# Patient Record
Sex: Male | Born: 1945
Health system: Southern US, Community
[De-identification: ages and names within clinical notes are randomized; demographics above are authoritative.]

## PROBLEM LIST (undated history)

## (undated) DIAGNOSIS — R112 Nausea with vomiting, unspecified: Secondary | ICD-10-CM

## (undated) DIAGNOSIS — Z87442 Personal history of urinary calculi: Secondary | ICD-10-CM

## (undated) DIAGNOSIS — T753XXA Motion sickness, initial encounter: Secondary | ICD-10-CM

## (undated) DIAGNOSIS — T4145XA Adverse effect of unspecified anesthetic, initial encounter: Secondary | ICD-10-CM

## (undated) DIAGNOSIS — I499 Cardiac arrhythmia, unspecified: Secondary | ICD-10-CM

## (undated) DIAGNOSIS — Z9889 Other specified postprocedural states: Secondary | ICD-10-CM

## (undated) DIAGNOSIS — T8859XA Other complications of anesthesia, initial encounter: Secondary | ICD-10-CM

## (undated) HISTORY — PX: CIRCUMCISION: SUR203

## (undated) HISTORY — PX: CYST EXCISION: SHX5701

## (undated) HISTORY — PX: COLONOSCOPY: SHX5424

## (undated) HISTORY — PX: VASECTOMY: SHX75

---

## 2010-04-09 HISTORY — PX: EYE SURGERY: SHX253

## 2011-01-22 ENCOUNTER — Ambulatory Visit: Payer: Self-pay | Admitting: Ophthalmology

## 2014-01-19 ENCOUNTER — Ambulatory Visit: Payer: Self-pay | Admitting: Internal Medicine

## 2014-01-19 IMAGING — US ULTRASOUND RETROPERITONEAL COMPLETE
1 series · 14 of 25 positions shown · non-contrast
Comparison: None.

CLINICAL DATA: Abdominal aortic aneurysm screening

EXAM:
ULTRASOUND RETROPERITONEAL COMPLETE
TECHNIQUE: Ultrasound examination of the abdominal aorta was performed to
evaluate for abdominal aortic aneurysm. The common iliac arteries,
IVC, and kidneys were also evaluated.

[Series 1: ultrasound retroperitoneal complete · 0.31mm/px · 14 of 49 slices shown]
[im 1/49]
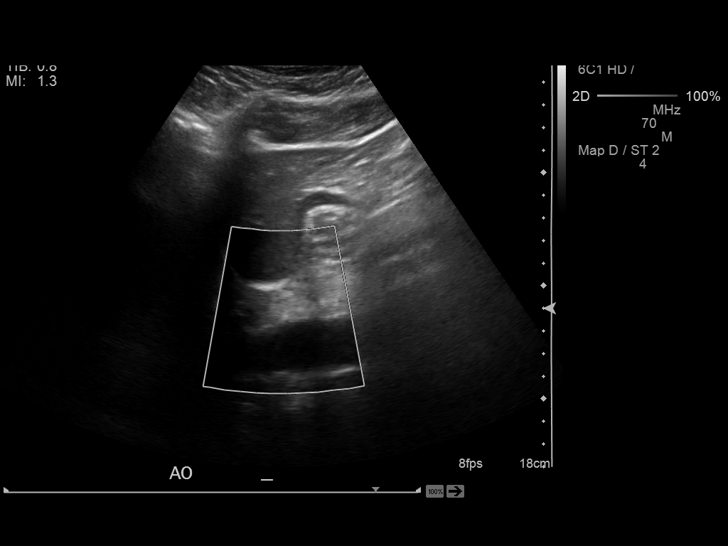
[im 5/49]
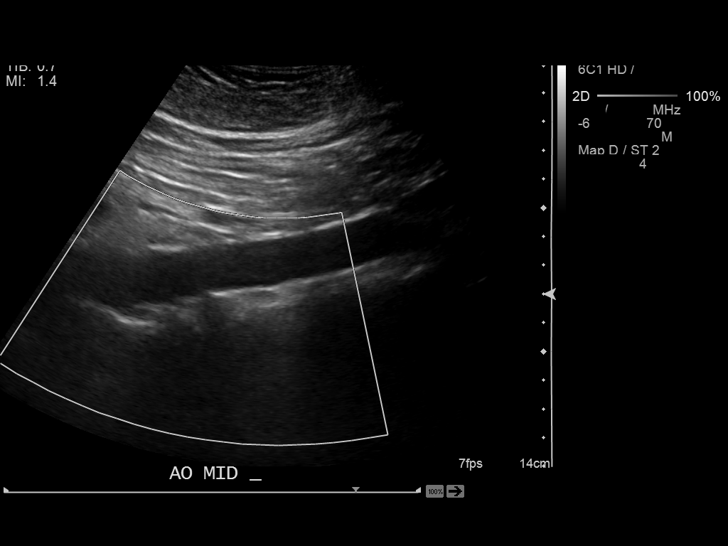
[im 9/49]
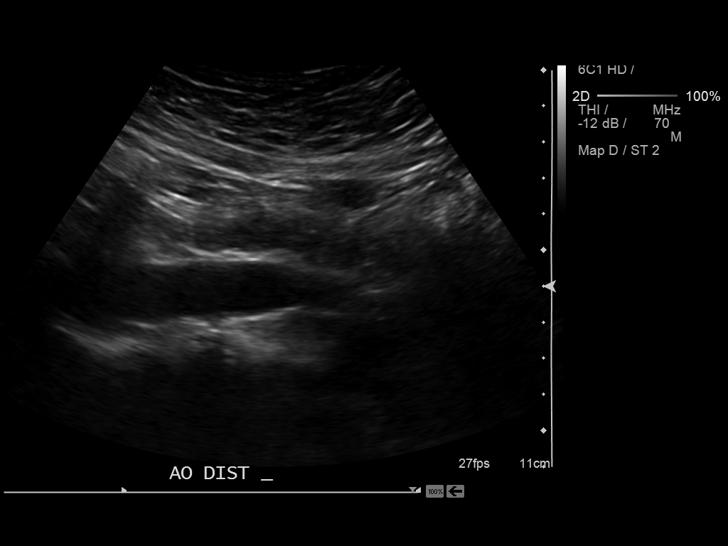
[im 13/49]
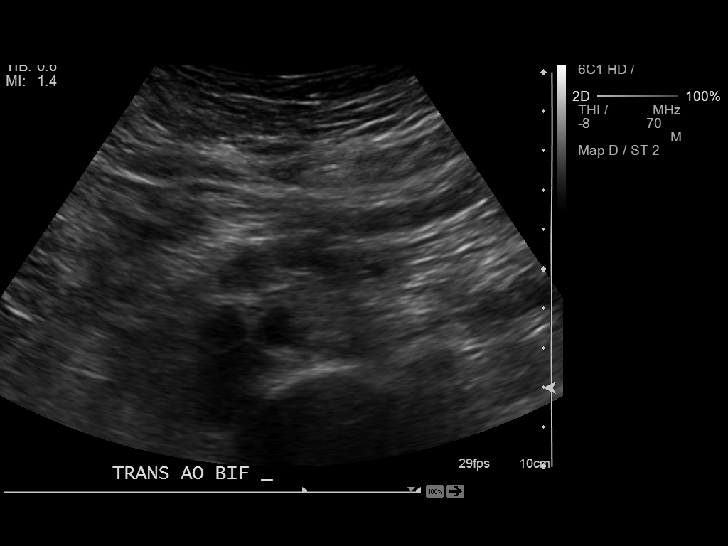
[im 17/49]
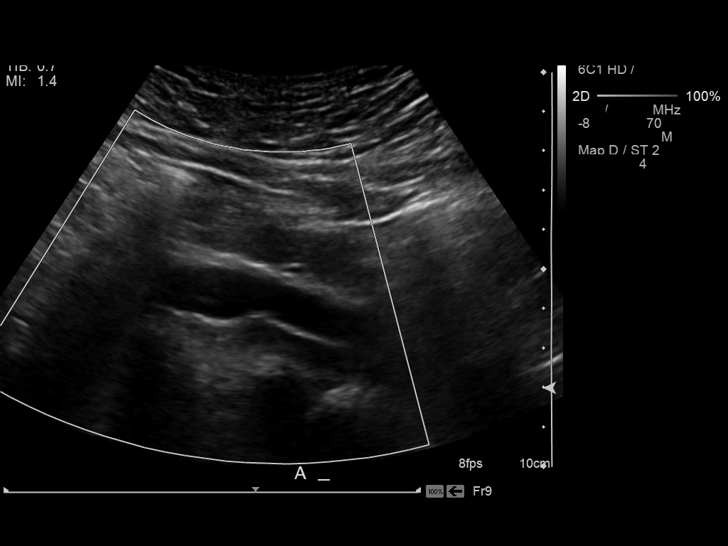
[im 19/49]
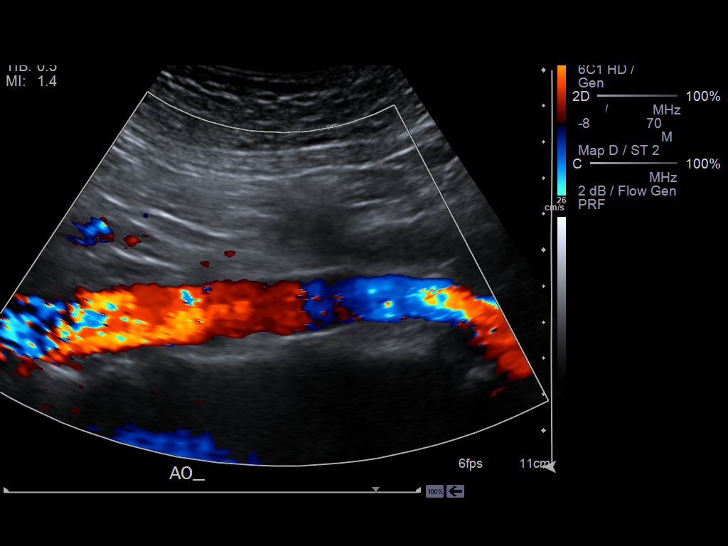
[im 23/49]
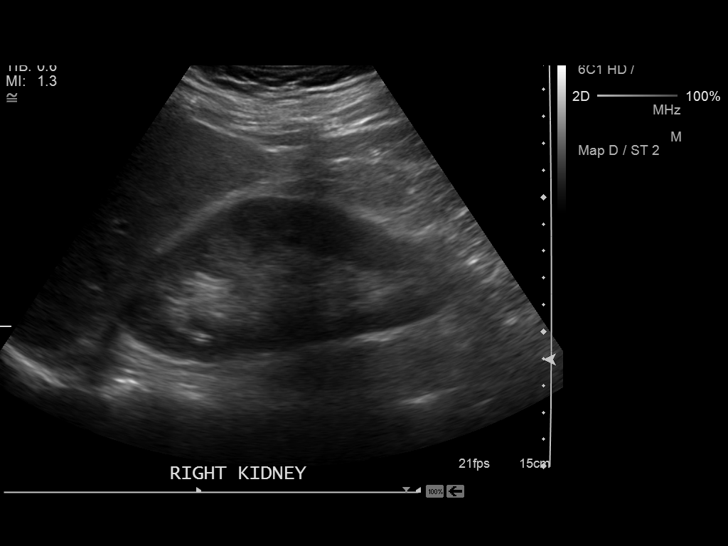
[im 27/49]
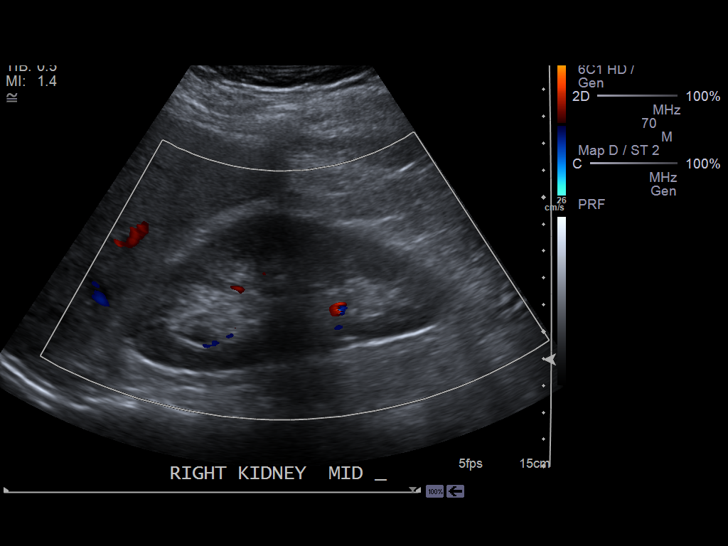
[im 31/49]
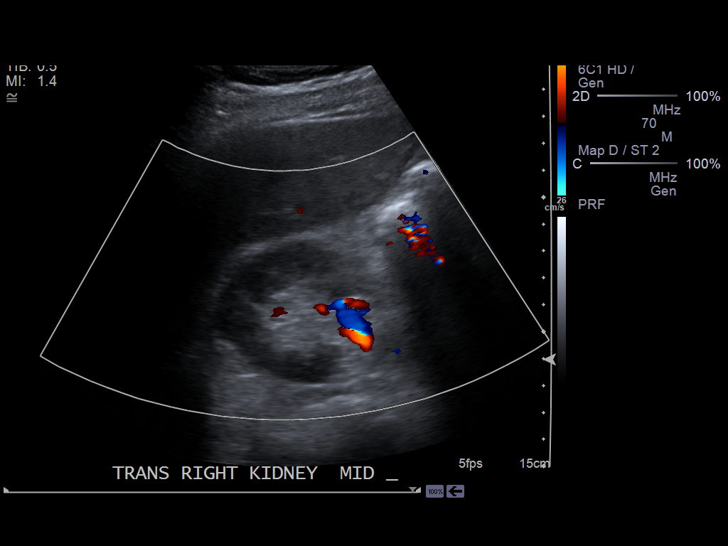
[im 33/49]
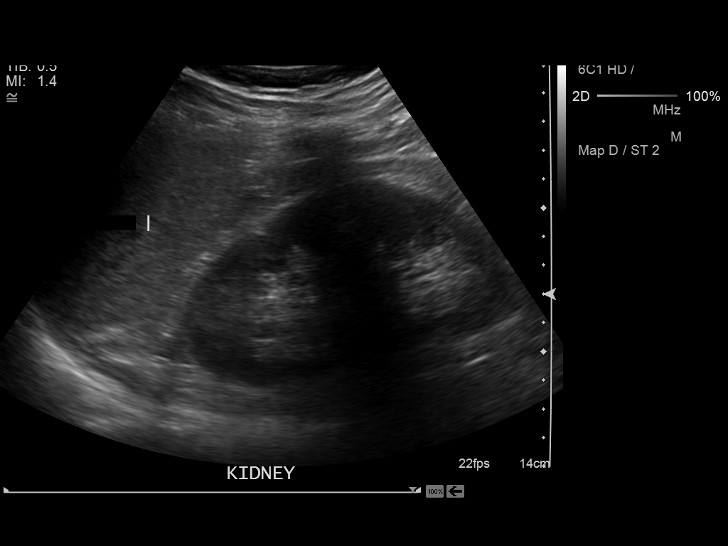
[im 37/49]
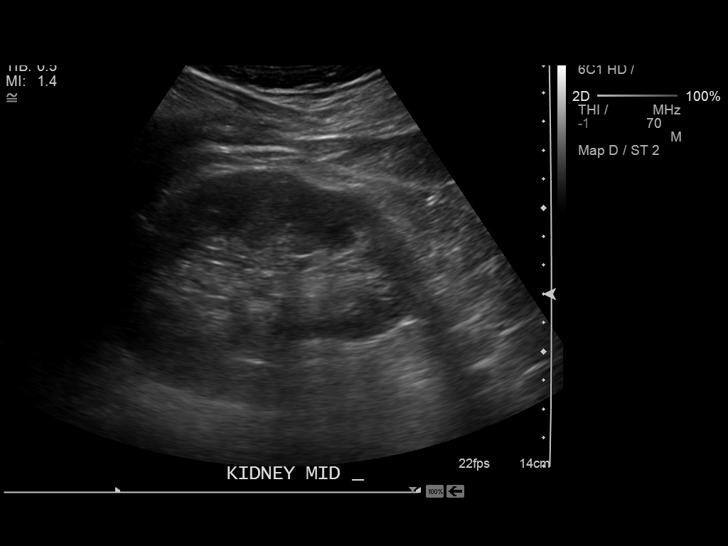
[im 41/49]
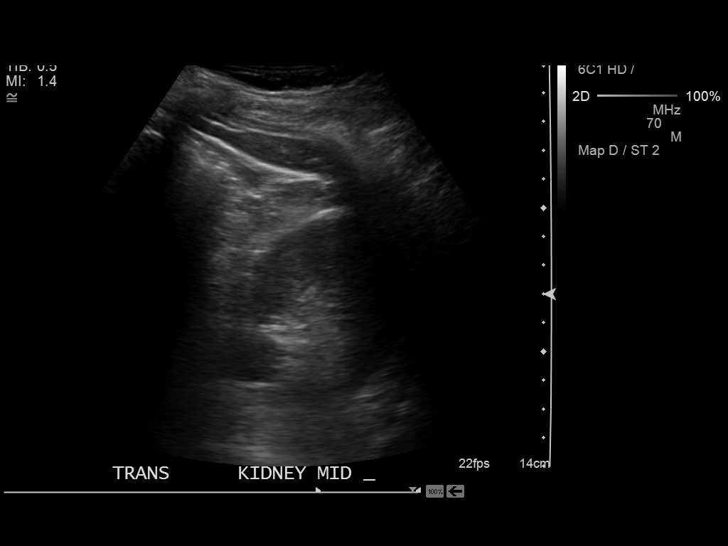
[im 45/49]
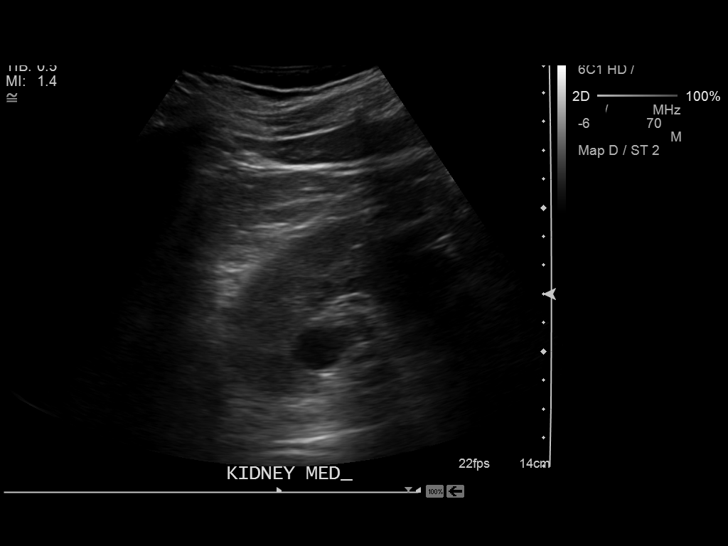
[im 49/49]
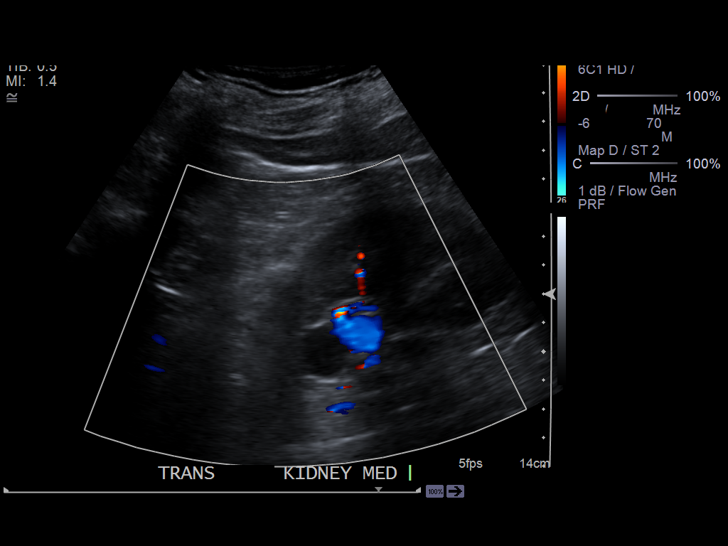

[14 of 25 positions shown; findings below may reference images not displayed]

FINDINGS: Abdominal Aorta

No aneurysm identified.

Maximum AP

Diameter:  1.9 cm in the mid aorta.

Maximum TRV

Diameter: 1.7 cm in the mid aorta.

Right Common Iliac Artery

No aneurysm identified.

Left Common Iliac Artery

No aneurysm identified.

IVC

No abnormality visualized.

Right Kidney

Length: 12.7 cm Echogenicity within normal limits. No mass or
hydronephrosis visualized.

Left Kidney

Length: 10.8 cm. 19 mm cyst. Echogenicity within normal limits. No
mass or hydronephrosis visualized.
IMPRESSION: Negative for abdominal aortic aneurysm.

## 2014-09-08 DIAGNOSIS — H4011X2 Primary open-angle glaucoma, moderate stage: Secondary | ICD-10-CM | POA: Diagnosis not present

## 2014-11-24 DIAGNOSIS — R3915 Urgency of urination: Secondary | ICD-10-CM | POA: Diagnosis not present

## 2014-11-24 DIAGNOSIS — N401 Enlarged prostate with lower urinary tract symptoms: Secondary | ICD-10-CM | POA: Diagnosis not present

## 2014-11-24 DIAGNOSIS — R351 Nocturia: Secondary | ICD-10-CM | POA: Diagnosis not present

## 2014-11-24 DIAGNOSIS — R3914 Feeling of incomplete bladder emptying: Secondary | ICD-10-CM | POA: Diagnosis not present

## 2014-12-28 DIAGNOSIS — D72819 Decreased white blood cell count, unspecified: Secondary | ICD-10-CM | POA: Diagnosis not present

## 2014-12-28 DIAGNOSIS — Z125 Encounter for screening for malignant neoplasm of prostate: Secondary | ICD-10-CM | POA: Diagnosis not present

## 2014-12-28 DIAGNOSIS — D6489 Other specified anemias: Secondary | ICD-10-CM | POA: Diagnosis not present

## 2014-12-28 DIAGNOSIS — N402 Nodular prostate without lower urinary tract symptoms: Secondary | ICD-10-CM | POA: Diagnosis not present

## 2014-12-28 DIAGNOSIS — M25511 Pain in right shoulder: Secondary | ICD-10-CM | POA: Diagnosis not present

## 2014-12-28 DIAGNOSIS — Z87898 Personal history of other specified conditions: Secondary | ICD-10-CM | POA: Diagnosis not present

## 2014-12-28 DIAGNOSIS — Z0000 Encounter for general adult medical examination without abnormal findings: Secondary | ICD-10-CM | POA: Diagnosis not present

## 2015-01-11 DIAGNOSIS — M25511 Pain in right shoulder: Secondary | ICD-10-CM | POA: Diagnosis not present

## 2015-01-11 DIAGNOSIS — D6489 Other specified anemias: Secondary | ICD-10-CM | POA: Diagnosis not present

## 2015-01-11 DIAGNOSIS — G8929 Other chronic pain: Secondary | ICD-10-CM | POA: Diagnosis not present

## 2015-01-11 DIAGNOSIS — Z87898 Personal history of other specified conditions: Secondary | ICD-10-CM | POA: Diagnosis not present

## 2015-01-11 DIAGNOSIS — Z0000 Encounter for general adult medical examination without abnormal findings: Secondary | ICD-10-CM | POA: Diagnosis not present

## 2015-01-11 DIAGNOSIS — N400 Enlarged prostate without lower urinary tract symptoms: Secondary | ICD-10-CM | POA: Diagnosis not present

## 2015-03-08 DIAGNOSIS — H401123 Primary open-angle glaucoma, left eye, severe stage: Secondary | ICD-10-CM | POA: Diagnosis not present

## 2015-05-13 DIAGNOSIS — R21 Rash and other nonspecific skin eruption: Secondary | ICD-10-CM | POA: Diagnosis not present

## 2015-05-13 DIAGNOSIS — Z7982 Long term (current) use of aspirin: Secondary | ICD-10-CM | POA: Diagnosis not present

## 2015-09-06 DIAGNOSIS — H401123 Primary open-angle glaucoma, left eye, severe stage: Secondary | ICD-10-CM | POA: Diagnosis not present

## 2015-09-30 DIAGNOSIS — H2511 Age-related nuclear cataract, right eye: Secondary | ICD-10-CM | POA: Diagnosis not present

## 2015-12-13 DIAGNOSIS — R350 Frequency of micturition: Secondary | ICD-10-CM | POA: Diagnosis not present

## 2015-12-13 DIAGNOSIS — R3914 Feeling of incomplete bladder emptying: Secondary | ICD-10-CM | POA: Diagnosis not present

## 2015-12-13 DIAGNOSIS — N5201 Erectile dysfunction due to arterial insufficiency: Secondary | ICD-10-CM | POA: Diagnosis not present

## 2015-12-13 DIAGNOSIS — N401 Enlarged prostate with lower urinary tract symptoms: Secondary | ICD-10-CM | POA: Diagnosis not present

## 2016-01-30 ENCOUNTER — Encounter
Admission: RE | Admit: 2016-01-30 | Discharge: 2016-01-30 | Disposition: A | Discharge status: Home / Self Care | Payer: Commercial Managed Care - HMO | Source: Ambulatory Visit | Attending: Urology | Admitting: Urology

## 2016-01-30 DIAGNOSIS — Z0000 Encounter for general adult medical examination without abnormal findings: Secondary | ICD-10-CM | POA: Diagnosis not present

## 2016-01-30 DIAGNOSIS — Z7982 Long term (current) use of aspirin: Secondary | ICD-10-CM | POA: Diagnosis not present

## 2016-01-30 HISTORY — DX: Personal history of urinary calculi: Z87.442

## 2016-01-30 HISTORY — DX: Other specified postprocedural states: Z98.890

## 2016-01-30 HISTORY — DX: Adverse effect of unspecified anesthetic, initial encounter: T41.45XA

## 2016-01-30 HISTORY — DX: Other complications of anesthesia, initial encounter: T88.59XA

## 2016-01-30 HISTORY — DX: Nausea with vomiting, unspecified: R11.2

## 2016-01-30 HISTORY — DX: Cardiac arrhythmia, unspecified: I49.9

## 2016-01-30 NOTE — Patient Instructions (Signed)
  Your procedure is scheduled on: 02-07-16 (TUESDAY) Report to Same Day Surgery 2nd floor medical mall To find out your arrival time please call [phone] between 1PM - 3PM on 02-06-16 [facility])  Remember: Instructions that are not followed completely may result in serious medical risk, up to and including death, or upon the discretion of your surgeon and anesthesiologist your surgery may need to be rescheduled.    _x___ 1. Do not eat food or drink liquids after midnight. No gum chewing or hard candies.     __x__ 2. No Alcohol for 24 hours before or after surgery.   __x__3. No Smoking for 24 prior to surgery.   ____  4. Bring all medications with you on the day of surgery if instructed.    __x__ 5. Notify your doctor if there is any change in your medical condition     (cold, fever, infections).     Do not wear jewelry, make-up, hairpins, clips or nail polish.  Do not wear lotions, powders, or perfumes. You may wear deodorant.  Do not shave 48 hours prior to surgery. Men may shave face and neck.  Do not bring valuables to the hospital.    [facility] is not responsible for any belongings or valuables.               Contacts, dentures or bridgework may not be worn into surgery.  Leave your suitcase in the car. After surgery it may be brought to your room.  For patients admitted to the hospital, discharge time is determined by your treatment team.   Patients discharged the day of surgery will not be allowed to drive home.    Please read over the following fact sheets that you were given:   [facility] Preparing for Surgery and or MRSA Information   ____ Take these medicines the morning of surgery with A SIP OF WATER:    1. NONE  2.  3.  4.  5.  6.  ____Fleets enema or Magnesium Citrate as directed.   ____ Use CHG Soap or sage wipes as directed on instruction sheet   ____ Use inhalers on the day of surgery and bring to hospital day of surgery  ____ Stop metformin 2  days prior to surgery    ____ Take 1/2 of usual insulin dose the night before surgery and none on the morning of   surgery.   _X___ Stop aspirin or coumadin, or plavix-STOP ASPIRIN NOW  x__ Stop Anti-inflammatories such as Advil, Aleve, Ibuprofen, Motrin, Naproxen,          Naprosyn, Goodies powders or aspirin products NOW- Ok to take Tylenol.   _X___ Stop supplements until after surgery-STOP FISH OIL   ____ Bring C-Pap to the hospital.

## 2016-01-31 ENCOUNTER — Ambulatory Visit
Admission: RE | Admit: 2016-01-31 | Discharge: 2016-01-31 | Disposition: A | Discharge status: Home / Self Care | Payer: Commercial Managed Care - HMO | Source: Ambulatory Visit | Attending: Urology | Admitting: Urology

## 2016-01-31 DIAGNOSIS — Z01810 Encounter for preprocedural cardiovascular examination: Secondary | ICD-10-CM | POA: Diagnosis not present

## 2016-01-31 DIAGNOSIS — I499 Cardiac arrhythmia, unspecified: Secondary | ICD-10-CM | POA: Insufficient documentation

## 2016-01-31 DIAGNOSIS — Z87442 Personal history of urinary calculi: Secondary | ICD-10-CM | POA: Insufficient documentation

## 2016-02-02 NOTE — H&P (Signed)
NAME:  ETHON, CAMPBEL NO.:  [id number]  MEDICAL RECORD NO.:  [id number]  LOCATION:                                 FACILITY:  PHYSICIAN:  Maryan Puls          DATE OF BIRTH:  1945/06/28  DATE OF ADMISSION: DATE OF DISCHARGE:                            HISTORY AND PHYSICAL   Same-day surgery, October 31.  CHIEF COMPLAINT:  Difficulty voiding.  HISTORY OF PRESENT ILLNESS:  Mr. Lorig is a 70 year old white male with a long history of BPH and lower urinary tract symptoms.  At the present time, he complains of incomplete emptying, frequency, intermittency, urgency, weak stream, straining, and nocturia.  AUA symptom score was 21 with a quality of life score of 5.  This is in spite of treatment with tamsulosin and finasteride.  The patient comes in now for photovaporization of the prostate with a GreenLight laser.  PAST MEDICAL HISTORY:  The patient has an allergy to penicillin.  CURRENT MEDICATIONS:  Included fish oil, vitamins, ibuprofen, aspirin, tamsulosin, and finasteride.  SURGICAL HISTORY: 1. Circumcision in 1983. 2. Cataract repair in 2012. 3. Glaucoma surgery in 2011.  SOCIAL HISTORY:  The patient quit smoking in 1975 with a 10 pack-year history.  He consumes 2 alcoholic beverages per week.  FAMILY HISTORY:  Remarkable for mother who is living at age 19 but has had a stroke.  PAST AND CURRENT MEDICAL CONDITIONS: 1. Osteoarthritis. 2. Glaucoma. 3. Tendinitis of the left ear. 4. Erectile dysfunction.  REVIEW OF SYSTEMS:  The patient denied chest pain, shortness of breath, diabetes, stroke, or hypertension.  PHYSICAL EXAMINATION:  GENERAL:  A well-nourished white male, in no acute distress. HEENT:  Sclerae were clear. NECK:  Supple.  No palpable cervical adenopathy. LUNGS:  Clear to auscultation. CARDIOVASCULAR:  Regular rhythm and rate without audible murmurs. ABDOMEN:  Soft, nontender abdomen. GU:  Circumcised testes, smooth, and  nontender.  No palpable hernia defects. RECTAL:  Greater than 50 g, smooth, nontender prostate. NEUROMUSCULAR:  Alert and oriented x3.  IMPRESSION:  Benign prostatic hypertrophy with bladder outlet obstruction.  PLAN:  Photovaporization of prostate with GreenLight laser.    ______________________________ Maryan Puls   ______________________________ Maryan Puls    MW/MEDQ  D:  02/01/2016  T:  02/02/2016  Job:  [id number]

## 2016-02-06 DIAGNOSIS — Z0000 Encounter for general adult medical examination without abnormal findings: Secondary | ICD-10-CM | POA: Diagnosis not present

## 2016-02-06 DIAGNOSIS — N400 Enlarged prostate without lower urinary tract symptoms: Secondary | ICD-10-CM | POA: Diagnosis not present

## 2016-02-06 DIAGNOSIS — D649 Anemia, unspecified: Secondary | ICD-10-CM | POA: Diagnosis not present

## 2016-02-06 DIAGNOSIS — Z23 Encounter for immunization: Secondary | ICD-10-CM | POA: Diagnosis not present

## 2016-02-06 DIAGNOSIS — D72819 Decreased white blood cell count, unspecified: Secondary | ICD-10-CM | POA: Diagnosis not present

## 2016-02-07 ENCOUNTER — Encounter
Admission: RE | Disposition: A | Discharge status: Home / Self Care | Payer: Self-pay | Source: Ambulatory Visit | Attending: Urology

## 2016-02-07 ENCOUNTER — Encounter: Payer: Self-pay | Admitting: *Deleted

## 2016-02-07 ENCOUNTER — Ambulatory Visit
Admission: RE | Admit: 2016-02-07 | Discharge: 2016-02-07 | Disposition: A | Discharge status: Home / Self Care | Payer: Commercial Managed Care - HMO | Source: Ambulatory Visit | Attending: Urology | Admitting: Urology

## 2016-02-07 ENCOUNTER — Ambulatory Visit: Payer: Commercial Managed Care - HMO | Admitting: Anesthesiology

## 2016-02-07 DIAGNOSIS — N138 Other obstructive and reflux uropathy: Secondary | ICD-10-CM | POA: Insufficient documentation

## 2016-02-07 DIAGNOSIS — N401 Enlarged prostate with lower urinary tract symptoms: Secondary | ICD-10-CM | POA: Diagnosis not present

## 2016-02-07 DIAGNOSIS — Z87891 Personal history of nicotine dependence: Secondary | ICD-10-CM | POA: Diagnosis not present

## 2016-02-07 DIAGNOSIS — Z7982 Long term (current) use of aspirin: Secondary | ICD-10-CM | POA: Insufficient documentation

## 2016-02-07 DIAGNOSIS — N400 Enlarged prostate without lower urinary tract symptoms: Secondary | ICD-10-CM | POA: Diagnosis not present

## 2016-02-07 DIAGNOSIS — N320 Bladder-neck obstruction: Secondary | ICD-10-CM | POA: Diagnosis not present

## 2016-02-07 HISTORY — PX: GREEN LIGHT LASER TURP (TRANSURETHRAL RESECTION OF PROSTATE: SHX6260

## 2016-02-07 SURGERY — GREEN LIGHT LASER TURP (TRANSURETHRAL RESECTION OF PROSTATE
Anesthesia: General

## 2016-02-07 MED ORDER — PHENYLEPHRINE HCL 10 MG/ML IJ SOLN
INTRAMUSCULAR | Status: DC | PRN
  Administered 2016-02-07: 100 ug via INTRAVENOUS
  Administered 2016-02-07: 50 ug via INTRAVENOUS

## 2016-02-07 MED ORDER — ACETAMINOPHEN-CODEINE #3 300-30 MG PO TABS: 1.0000 | ORAL_TABLET | ORAL | 2 refills | Status: DC | PRN

## 2016-02-07 MED ORDER — LACTATED RINGERS IV SOLN
INTRAVENOUS | Status: DC
  Administered 2016-02-07: 11:00:00 via INTRAVENOUS

## 2016-02-07 MED ORDER — LACTATED RINGERS IV SOLN
Freq: Once | INTRAVENOUS | Status: AC
  Administered 2016-02-07: 14:00:00 via INTRAVENOUS

## 2016-02-07 MED ORDER — PROPOFOL 10 MG/ML IV BOLUS
INTRAVENOUS | Status: DC | PRN
  Administered 2016-02-07: 170 mg via INTRAVENOUS

## 2016-02-07 MED ORDER — FENTANYL CITRATE (PF) 100 MCG/2ML IJ SOLN
INTRAMUSCULAR | Status: DC | PRN
  Administered 2016-02-07 (×4): 25 ug via INTRAVENOUS

## 2016-02-07 MED ORDER — FAMOTIDINE 20 MG PO TABS
ORAL_TABLET | ORAL | Status: AC
  Filled 2016-02-07: qty 1

## 2016-02-07 MED ORDER — ONDANSETRON HCL 4 MG/2ML IJ SOLN
INTRAMUSCULAR | Status: DC | PRN
  Administered 2016-02-07: 4 mg via INTRAVENOUS

## 2016-02-07 MED ORDER — ONDANSETRON HCL 4 MG/2ML IJ SOLN: 4.0000 mg | Freq: Once | INTRAMUSCULAR | Status: DC | PRN

## 2016-02-07 MED ORDER — ONDANSETRON 8 MG PO TBDP: 8.0000 mg | ORAL_TABLET | Freq: Four times a day (QID) | ORAL | 3 refills | Status: DC | PRN

## 2016-02-07 MED ORDER — LIDOCAINE HCL 2 % EX GEL
CUTANEOUS | Status: DC | PRN
  Administered 2016-02-07: 1 via URETHRAL

## 2016-02-07 MED ORDER — DEXAMETHASONE SODIUM PHOSPHATE 10 MG/ML IJ SOLN
INTRAMUSCULAR | Status: DC | PRN
  Administered 2016-02-07: 10 mg via INTRAVENOUS

## 2016-02-07 MED ORDER — FENTANYL CITRATE (PF) 100 MCG/2ML IJ SOLN
INTRAMUSCULAR | Status: AC
  Administered 2016-02-07: 25 ug via INTRAVENOUS
  Filled 2016-02-07: qty 2

## 2016-02-07 MED ORDER — LEVOFLOXACIN IN D5W 500 MG/100ML IV SOLN
500.0000 mg | INTRAVENOUS | Status: DC
  Administered 2016-02-07: 500 mg via INTRAVENOUS

## 2016-02-07 MED ORDER — LEVOFLOXACIN IN D5W 500 MG/100ML IV SOLN
INTRAVENOUS | Status: AC
  Filled 2016-02-07: qty 100

## 2016-02-07 MED ORDER — BELLADONNA ALKALOIDS-OPIUM 16.2-60 MG RE SUPP
RECTAL | Status: DC | PRN
  Administered 2016-02-07: 1 via RECTAL

## 2016-02-07 MED ORDER — FAMOTIDINE 20 MG PO TABS
20.0000 mg | ORAL_TABLET | Freq: Once | ORAL | Status: AC
  Administered 2016-02-07: 20 mg via ORAL

## 2016-02-07 MED ORDER — LIDOCAINE HCL (CARDIAC) 20 MG/ML IV SOLN
INTRAVENOUS | Status: DC | PRN
  Administered 2016-02-07: 60 mg via INTRAVENOUS

## 2016-02-07 MED ORDER — BELLADONNA ALKALOIDS-OPIUM 16.2-60 MG RE SUPP
RECTAL | Status: AC
  Filled 2016-02-07: qty 1

## 2016-02-07 MED ORDER — LIDOCAINE HCL 2 % EX GEL
CUTANEOUS | Status: AC
  Filled 2016-02-07: qty 10

## 2016-02-07 MED ORDER — FENTANYL CITRATE (PF) 100 MCG/2ML IJ SOLN
25.0000 ug | INTRAMUSCULAR | Status: AC | PRN
  Administered 2016-02-07 (×6): 25 ug via INTRAVENOUS

## 2016-02-07 MED ORDER — URIBEL 118 MG PO CAPS: 1.0000 | ORAL_CAPSULE | Freq: Four times a day (QID) | ORAL | 3 refills | Status: DC | PRN

## 2016-02-07 MED ORDER — MIDAZOLAM HCL 5 MG/5ML IJ SOLN
INTRAMUSCULAR | Status: DC | PRN
  Administered 2016-02-07: 2 mg via INTRAVENOUS

## 2016-02-07 MED ORDER — LEVOFLOXACIN 500 MG PO TABS: 500.0000 mg | ORAL_TABLET | Freq: Every day | ORAL | 0 refills | Status: DC

## 2016-02-07 MED ORDER — DOCUSATE SODIUM 100 MG PO CAPS: 200.0000 mg | ORAL_CAPSULE | Freq: Two times a day (BID) | ORAL | 3 refills | Status: DC

## 2016-02-07 SURGICAL SUPPLY — 28 items
ADAPTER IRRIG TUBE 2 SPIKE SOL (ADAPTER) ×6 IMPLANT
BAG URO DRAIN 2000ML W/SPOUT (MISCELLANEOUS) ×3 IMPLANT
CATH FOLEY 2WAY  5CC 20FR SIL (CATHETERS) ×2
CATH FOLEY 2WAY 5CC 20FR SIL (CATHETERS) ×1 IMPLANT
GLOVE BIO SURGEON STRL SZ7 (GLOVE) ×6 IMPLANT
GLOVE BIO SURGEON STRL SZ7.5 (GLOVE) ×3 IMPLANT
GOWN STRL REUS W/ TWL LRG LVL3 (GOWN DISPOSABLE) ×1 IMPLANT
GOWN STRL REUS W/ TWL XL LVL3 (GOWN DISPOSABLE) ×1 IMPLANT
GOWN STRL REUS W/TWL LRG LVL3 (GOWN DISPOSABLE) ×2
GOWN STRL REUS W/TWL XL LVL3 (GOWN DISPOSABLE) ×2
IV NS 1000ML (IV SOLUTION) ×2
IV NS 1000ML BAXH (IV SOLUTION) ×1 IMPLANT
IV SET PRIMARY 15D 139IN B9900 (IV SETS) ×3 IMPLANT
KIT RM TURNOVER CYSTO AR (KITS) ×3 IMPLANT
LASER GRNLGT 950 (MISCELLANEOUS) ×3 IMPLANT
LASER GRNLGT MOXY FIBER 750UM (MISCELLANEOUS) ×3 IMPLANT
PACK CYSTO AR (MISCELLANEOUS) ×3 IMPLANT
PREP PVP WINGED SPONGE (MISCELLANEOUS) ×3 IMPLANT
SET IRRIG Y TYPE TUR BLADDER L (SET/KITS/TRAYS/PACK) ×3 IMPLANT
SOL .9 NS 3000ML IRR  AL (IV SOLUTION) ×10
SOL .9 NS 3000ML IRR UROMATIC (IV SOLUTION) ×5 IMPLANT
SOL PREP PVP 2OZ (MISCELLANEOUS) ×3
SOLUTION PREP PVP 2OZ (MISCELLANEOUS) ×1 IMPLANT
SURGILUBE 2OZ TUBE FLIPTOP (MISCELLANEOUS) ×3 IMPLANT
SYRINGE IRR TOOMEY STRL 70CC (SYRINGE) ×3 IMPLANT
TUBING CONNECTING 10 (TUBING) ×2 IMPLANT
TUBING CONNECTING 10' (TUBING) ×1
WATER STERILE IRR 1000ML POUR (IV SOLUTION) ×3 IMPLANT

## 2016-02-07 NOTE — H&P (Signed)
Date of Initial H&P: 02/02/16  History reviewed, patient examined, no change in status, stable for surgery.

## 2016-02-07 NOTE — Anesthesia Postprocedure Evaluation (Signed)
Anesthesia Post Note  Patient: Andrew Rice  Procedure(s) Performed: Procedure(s) (LRB): GREEN LIGHT LASER TURP (TRANSURETHRAL RESECTION OF PROSTATE (N/A)  Patient location during evaluation: PACU Anesthesia Type: General Level of consciousness: awake and alert Pain management: pain level controlled Vital Signs Assessment: post-procedure vital signs reviewed and stable Respiratory status: spontaneous breathing, nonlabored ventilation, respiratory function stable and patient connected to nasal cannula oxygen Cardiovascular status: blood pressure returned to baseline and stable Postop Assessment: no signs of nausea or vomiting Anesthetic complications: no    Last Vitals:  Vitals:   02/07/16 1414 02/07/16 1433  BP: 119/61 129/66  Pulse: 73 73  Resp: (!) 9 15  Temp:  36.2 C    Last Pain:  Vitals:   02/07/16 1433  TempSrc: Temporal  PainSc: [place]

## 2016-02-07 NOTE — Transfer of Care (Signed)
Immediate Anesthesia Transfer of Care Note  Patient: Andrew Rice  Procedure(s) Performed: Procedure(s): GREEN LIGHT LASER TURP (TRANSURETHRAL RESECTION OF PROSTATE (N/A)  Patient Location: PACU  Anesthesia Type:General  Level of Consciousness: sedated  Airway & Oxygen Therapy: Patient Spontanous Breathing and Patient connected to face mask oxygen  Post-op Assessment: Report given to RN and Post -op Vital signs reviewed and stable  Post vital signs: Reviewed  Last Vitals:  Vitals:   02/07/16 1029 02/07/16 1259  BP: (!) 141/86 134/90  Pulse: 76   Resp: 18 15  Temp: 36.4 C     Last Pain:  Vitals:   02/07/16 1029  TempSrc: Tympanic         Complications: No apparent anesthesia complications

## 2016-02-07 NOTE — Op Note (Signed)
Preoperative diagnosis: BPH with bladder outlet obstruction   Postoperative diagnosis: Same  Procedure: Photo vaporization of the prostate with greenlight laser  Surgeon: Otelia Limes. Yves Dill MD  Anesthesia: General  Indications:See the history and physical. After informed consent the above procedure(s) were requested     Technique and findings: After adequate general anesthesia had been obtained the patient was placed into dorsal lithotomy position and the perineum prepped and draped in the usual fashion. The laserscope was coupled to the camera and visually advanced into the bladder. Bladder was thoroughly inspected. Ureteral orifices were identified and had clear reflux. No bladder tumors were identified. The bladder was moderately trabeculated. Patient was noted to have lateral lobe prostatic hypertrophy with visual obstruction. Prostatic urethral length was approximately 4 cm. At this point the XPS laser fiber was introduced through the scope and power set at 93 W. The bladder neck tissue was vaporized. Vaporization of the prostate proceeded to the verumontanum. At this point power was increased to 120 W and remaining obstructive tissue vaporized. Bleeders were controlled with the coag setting. The scope was then removed and 10 cc of viscous Xylocaine instilled within the urethra. A 20 French silicone catheter was placed. A catheter was irrigated until clear. A B&O suppository placed and procedure was terminated. The patient was then transferred to the recovery room in stable condition.

## 2016-02-07 NOTE — Anesthesia Procedure Notes (Signed)
Procedure Name: LMA Insertion Date/Time: 02/07/2016 11:21 AM Performed by: Dionne Bucy Pre-anesthesia Checklist: Patient identified, Patient being monitored, Timeout performed, Emergency Drugs available and Suction available Patient Re-evaluated:Patient Re-evaluated prior to inductionOxygen Delivery Method: Circle system utilized Preoxygenation: Pre-oxygenation with 100% oxygen Intubation Type: IV induction Ventilation: Mask ventilation without difficulty LMA: LMA inserted LMA Size: 5.0 Tube type: Oral Number of attempts: 1 Placement Confirmation: positive ETCO2 and breath sounds checked- equal and bilateral Tube secured with: Tape Dental Injury: Teeth and Oropharynx as per pre-operative assessment

## 2016-02-07 NOTE — Anesthesia Preprocedure Evaluation (Addendum)
Anesthesia Evaluation  Patient identified by MRN, date of birth, ID band Patient awake    Reviewed: Allergy & Precautions, NPO status , Patient's Chart, lab work & pertinent test results, reviewed documented beta blocker date and time   History of Anesthesia Complications (+) PONV  Airway Mallampati: II  TM Distance: >3 FB     Dental  (+) Chipped   Pulmonary former smoker,           Cardiovascular + dysrhythmias      Neuro/Psych    GI/Hepatic   Endo/Other    Renal/GU      Musculoskeletal   Abdominal   Peds  Hematology   Anesthesia Other Findings EKG ok.  Reproductive/Obstetrics                            Anesthesia Physical Anesthesia Plan  ASA: II  Anesthesia Plan: General   Post-op Pain Management:    Induction: Intravenous  Airway Management Planned: LMA  Additional Equipment:   Intra-op Plan:   Post-operative Plan:   Informed Consent: I have reviewed the patients History and Physical, chart, labs and discussed the procedure including the risks, benefits and alternatives for the proposed anesthesia with the patient or authorized representative who has indicated his/her understanding and acceptance.     Plan Discussed with: CRNA  Anesthesia Plan Comments:         Anesthesia Quick Evaluation

## 2016-02-07 NOTE — Discharge Instructions (Addendum)
Benign Prostatic Hyperplasia An enlarged prostate (benign prostatic hyperplasia) is common in older men. You may experience the following:  Weak urine stream.  Dribbling.  Feeling like the bladder has not emptied completely.  Difficulty starting urination.  Getting up frequently at night to urinate.  Urinating more frequently during the day. HOME CARE INSTRUCTIONS  Monitor your prostatic hyperplasia for any changes. The following actions may help to alleviate any discomfort you are experiencing:  Give yourself time when you urinate.  Stay away from alcohol.  Avoid beverages containing caffeine, such as coffee, tea, and colas, because they can make the problem worse.  Avoid decongestants, antihistamines, and some prescription medicines that can make the problem worse.  Follow up with your health care provider for further treatment as recommended. SEEK MEDICAL CARE IF:  You are experiencing progressive difficulty voiding.  Your urine stream is progressively getting narrower.  You are awaking from sleep with the urge to void more frequently.  You are constantly feeling the need to void.  You experience loss of urine, especially in small amounts. SEEK IMMEDIATE MEDICAL CARE IF:   You develop increased pain with urination or are unable to urinate.  You develop severe abdominal pain, vomiting, a high fever, or fainting.  You develop back pain or blood in your urine. MAKE SURE YOU:   Understand these instructions.  Will watch your condition.  Will get help right away if you are not doing well or get worse.   This information is not intended to replace advice given to you by your health care provider. Make sure you discuss any questions you have with your health care provider.   Document Released: 03/26/2005 Document Revised: 04/16/2014 Document Reviewed: 08/26/2012 Elsevier Interactive Patient Education 2016 [place] Light Laser Prostate Treatment Green  light laser therapy is a procedure that uses a special high-energy laser for vaporizing extra prostate tissue. It is less invasive than traditional methods of prostate surgery, which involve cutting out the prostate tissue. Because the tissue is vaporized rather than cut out there is generally less blood loss. LET [facility] CARE PROVIDER KNOW ABOUT:  Any allergies you have.  Any medicines you are taking, including vitamins, herbs, eye drops, creams, and over-the-counter medication.  Previous problems you or members of your family have had with the use of anesthetics.  Any blood disorders you have.  Previous surgeries you have had.  Medical conditions you have. RISKS AND COMPLICATIONS Generally, green light laser prostate treatment is a safe procedure. However, as with any procedure, complications can occur. Possible complications include:  Urinary tract infection.  Erectile dysfunction (rare).  Dry ejaculation--Semen is not released when you reach sexual climax.  Scar tissue in the urinary passage. BEFORE THE PROCEDURE   Your health care provider may discuss medicines you are taking and may advise you to stop taking specific ones.  You may be given antibiotic medicine to take as a precaution against bacterial infection.  Do not eat or drink anything for 8 hours before your procedure or as directed by your health care provider. You may have a sip of water to take any necessary medicines. PROCEDURE Depending on the size and shape of your prostate, the procedure may take 30-60 minutes. You will be given one of the following:   A medicine that makes you go to sleep (general anesthetic).  A medicine injected into your spine that numbs your body below the waist (spinal anesthetic). Sedation is usually given with spinal anesthetic so  you will be relaxed. A tube containing viewing scopes and instruments will be inserted through your penis so that no cuts (incisions) are needed. A thin  fiber is put through the tube and positioned next to the excess prostate tissue. Pulses of laser light come from the end of the fiber and are projected onto the excess tissue. The laser beam is absorbed by your blood, which becomes hot enough to vaporize the excess prostate tissue. This laser beam will seal off the blood vessels, decreasing bleeding. The tube with the viewing scopes, instruments, and thin fiber will be removed and replaced with a temporary catheter. AFTER THE PROCEDURE  After the surgery, you will be sent to the recovery room for a short time. Depending on factors such as the amount of prostate tissue vaporized, the strength of your bladder, and the amount of bleeding expected, the catheter may be removed. Generally, overnight stay is not needed and you will be sent home on the same day as the procedure. You may be sent home with elastic support stockings to help prevent blood clots in your legs.    This information is not intended to replace advice given to you by your health care provider. Make sure you discuss any questions you have with your health care provider.   Document Released: 07/03/2007 Document Revised: 03/31/2013 Document Reviewed: 09/15/2012 Elsevier Interactive Patient Education 2016 [facility] Prostate laser surgery is a procedure to eliminate prostate tissue. There are two types of prostate laser surgery: ablation (prostate tissue is melted away) and enucleation (prostate tissue is cut out). LET [facility] CARE PROVIDER KNOW ABOUT:  Any allergies you have.  All medicines you are taking, including vitamins, herbs, eye drops, creams, and over-the-counter medicines.  Previous problems you or members of your family have had with the use of anesthetics.  Any blood disorders you have.  Previous surgeries you have had.  Medical conditions you have. RISKS AND COMPLICATIONS  Generally prostate laser surgery is a safe procedure.  However, as with any procedure, problems can occur. Possible problems include:  Bleeding and the need for a blood transfusion.   Urinary tract infection.  Erectile dysfunction.  Narrowing (scar or stricture) of the urethra, which blocks the flow of urine.  Dry ejaculation (semen is not released when you reach sexual climax). BEFORE THE PROCEDURE   If you are on blood thinners, such as warfarin or clopidogrel, or nonprescription pain-relieving medicines, such as naproxen sodium or ibuprofen, you may be asked to stop taking them before the procedure.  Your health care provider may ask you to start taking antibiotic medicines before the procedure as a precaution against a bacterial infection. The procedure will not be performed if your urine is infected.  You should have nothing to eat or drink for at least 8 hours before your procedure, or as suggested by your health care provider. You may have a sip of water to take medications not stopped for the procedure. PROCEDURE  You will be given one of the following:   A medicine that numbs the area (local anesthetic).  A medicine injected into your spine that numbs your body below the waist (spinal anesthetic). A sedative is usually given with spinal anesthetic so you will be relaxed during the procedure. A viewing scope and instruments will be placed in a tube that is inserted through your penis, so no incisions will be needed to insert the scope and instruments. Depending on the type of laser  used, the prostate tissue will either be vaporized or cut away. The laser beam will coagulate any small bleeding areas. At the end of the surgery, a special tube will be inserted into your bladder to drain the urine from your bladder (urinary catheter). AFTER THE PROCEDURE You will be sent to the recovery room for a short time. In the recovery room, you will receive fluids through an IV tube inserted in one of your veins. Your blood pressure and pulse will  be checked frequently to make sure that they stabilize. Once you are eating and drinking fluids appropriately, the IV tube will be removed.  Depending on your specific needs, you may be admitted to the hospital or you will be sent home after the procedure. If you are sent home:  You may be sent home with elastic support stockings to help prevent blood clots in your legs.  You will also probably be given an antibiotic medicine.  Unless told otherwise, you may restart your other medications.  You may be given a stool softener.   This information is not intended to replace advice given to you by your health care provider. Make sure you discuss any questions you have with your health care provider.   Document Released: 03/26/2005 Document Revised: 03/31/2013 Document Reviewed: 09/15/2012 Elsevier Interactive Patient Education 2016 [place], Adult A Foley catheter is a soft, flexible tube that is placed into the bladder to drain urine. A Foley catheter may be inserted if:  You leak urine or are not able to control when you urinate (urinary incontinence).  You are not able to urinate when you need to (urinary retention).  You had prostate surgery or surgery on the genitals.  You have certain medical conditions, such as multiple sclerosis, dementia, or a spinal cord injury. If you are going home with a Foley catheter in place, follow the instructions below. TAKING CARE OF THE CATHETER 1. Wash your hands with soap and water. 2. Using mild soap and warm water on a clean washcloth:  Clean the area on your body closest to the catheter insertion site using a circular motion, moving away from the catheter. Never wipe toward the catheter because this could sweep bacteria up into the urethra and cause infection.  Remove all traces of soap. Pat the area dry with a clean towel. For males, reposition the foreskin. 3. Attach the catheter to your leg so there is no tension on  the catheter. Use adhesive tape or a leg strap. If you are using adhesive tape, remove any sticky residue left behind by the previous tape you used. 4. Keep the drainage bag below the level of the bladder, but keep it off the floor. 5. Check throughout the day to be sure the catheter is working and urine is draining freely. Make sure the tubing does not become kinked. 6. Do not pull on the catheter or try to remove it. Pulling could damage internal tissues. TAKING CARE OF THE DRAINAGE BAGS You will be given two drainage bags to take home. One is a large overnight drainage bag, and the other is a smaller leg bag that fits underneath clothing. You may wear the overnight bag at any time, but you should never wear the smaller leg bag at night. Follow the instructions below for how to empty, change, and clean your drainage bags. Emptying the Drainage Bag You must empty your drainage bag when it is  - full or at least 2-3 times a day.  1. Wash your hands with soap and water. 2. Keep the drainage bag below your hips, below the level of your bladder. This stops urine from going back into the tubing and into your bladder. 3. Hold the dirty bag over the toilet or a clean container. 4. Open the pour spout at the bottom of the bag and empty the urine into the toilet or container. Do not let the pour spout touch the toilet, container, or any other surface. Doing so can place bacteria on the bag, which can cause an infection. 5. Clean the pour spout with a gauze pad or cotton ball that has rubbing alcohol on it. 6. Close the pour spout. 7. Attach the bag to your leg with adhesive tape or a leg strap. 8. Wash your hands well. Changing the Drainage Bag Change your drainage bag once a month or sooner if it starts to smell bad or look dirty. Below are steps to follow when changing the drainage bag. 1. Wash your hands with soap and water. 2. Pinch off the rubber catheter so that urine does not spill  out. 3. Disconnect the catheter tube from the drainage tube at the connection valve. Do not let the tubes touch any surface. 4. Clean the end of the catheter tube with an alcohol wipe. Use a different alcohol wipe to clean the end of the drainage tube. 5. Connect the catheter tube to the drainage tube of the clean drainage bag. 6. Attach the new bag to the leg with adhesive tape or a leg strap. Avoid attaching the new bag too tightly. 7. Wash your hands well. Cleaning the Drainage Bag 1. Wash your hands with soap and water. 2. Wash the bag in warm, soapy water. 3. Rinse the bag thoroughly with warm water. 4. Fill the bag with a solution of white vinegar and water (1 cup vinegar to 1 qt warm water [.2 L vinegar to 1 L warm water]). Close the bag and soak it for 30 minutes in the solution. 5. Rinse the bag with warm water. 6. Hang the bag to dry with the pour spout open and hanging downward. 7. Store the clean bag (once it is dry) in a clean plastic bag. 8. Wash your hands well. PREVENTING INFECTION  Wash your hands before and after handling your catheter.  Take showers daily and wash the area where the catheter enters your body. Do not take baths. Replace wet leg straps with dry ones, if this applies.  Do not use powders, sprays, or lotions on the genital area. Only use creams, lotions, or ointments as directed by your caregiver.  For females, wipe from front to back after each bowel movement.  Drink enough fluids to keep your urine clear or pale yellow unless you have a fluid restriction.  Do not let the drainage bag or tubing touch or lie on the floor.  Wear cotton underwear to absorb moisture and to keep your skin drier. SEEK MEDICAL CARE IF:   Your urine is cloudy or smells unusually bad.  Your catheter becomes clogged.  You are not draining urine into the bag or your bladder feels full.  Your catheter starts to leak. SEEK IMMEDIATE MEDICAL CARE IF:   You have pain,  swelling, redness, or pus where the catheter enters the body.  You have pain in the abdomen, legs, lower back, or bladder.  You have a fever.  You see blood fill the catheter, or your urine is pink or red.  You have  nausea, vomiting, or chills.  Your catheter gets pulled out. MAKE SURE YOU:   Understand these instructions.  Will watch your condition.  Will get help right away if you are not doing well or get worse.   This information is not intended to replace advice given to you by your health care provider. Make sure you discuss any questions you have with your health care provider.   Document Released: 03/26/2005 Document Revised: 08/10/2013 Document Reviewed: 03/17/2012 Elsevier Interactive Patient Education 2016 [facility], Care After Refer to this sheet in the next few weeks. These instructions provide you with information on caring for yourself after your procedure. Your health care provider may also give you more specific instructions. Your treatment has been planned according to current medical practices, but problems sometimes occur. Call your health care provider if you have any problems or questions after your procedure. WHAT TO EXPECT AFTER THE PROCEDURE  You may notice blood in your urine that could last up to 3 weeks.  After your catheter is removed, you will have burning (especially at the tip of your penis) when you urinate, especially at the end of urination. For the first few weeks after your procedure, you will feel the need to urinate often. HOME CARE INSTRUCTIONS   Do not perform vigorous exercise, especially heavy lifting, for 1 week or as directed by your health care provider.  Avoid sexual activity for 4-6 weeks or as directed by your health care provider.  Do not ride in a car for extended periods for 1 month or as directed by your health care provider.  Do not strain to have a bowel movement. Drink a lot of fluids and and  make sure you get enough fiber in your diet.  Drink enough fluids to keep your urine clear or pale yellow. SEEK IMMEDIATE MEDICAL CARE IF:   Your catheter has been removed and you are suddenly unable to urinate.  Your catheter has not been removed, and it develops a blockage.  You start to have blood clots in your urine.  The blood in your urine becomes persistent or gets thick.  Your temperature is greater than 100.37F (38.1C).  You develop chest pains.  You develop shortness of breath.  You develop leg swelling or pain. MAKE SURE YOU:  Understand these instructions.  Will watch your condition.  Will get help right away if you are not doing well or get worse.   This information is not intended to replace advice given to you by your health care provider. Make sure you discuss any questions you have with your health care provider.   Document Released: 03/26/2005 Document Revised: 03/31/2013 Document Reviewed: 09/15/2012 Elsevier Interactive Patient Education [employer].  AMBULATORY SURGERY  DISCHARGE INSTRUCTIONS   1) The drugs that you were given will stay in your system until tomorrow so for the next 24 hours you should not:  A) Drive an automobile B) Make any legal decisions C) Drink any alcoholic beverage   2) You may resume regular meals tomorrow.  Today it is better to start with liquids and gradually work up to solid foods.  You may eat anything you prefer, but it is better to start with liquids, then soup and crackers, and gradually work up to solid foods.   3) Please notify your doctor immediately if you have any unusual bleeding, trouble breathing, redness and pain at the surgery site, drainage, fever, or pain not relieved by medication.    4) Additional Instructions:   DRINK  DRINK  DRINK  Call Dr.  Yves Dill if you have any concerns   Please contact your physician with any problems or Same Day Surgery at [phone], Monday through Friday 6 am to 4 pm, or  at [facility] number at [phone].

## 2016-02-23 DIAGNOSIS — R3914 Feeling of incomplete bladder emptying: Secondary | ICD-10-CM | POA: Diagnosis not present

## 2016-02-23 DIAGNOSIS — R350 Frequency of micturition: Secondary | ICD-10-CM | POA: Diagnosis not present

## 2016-02-23 DIAGNOSIS — R351 Nocturia: Secondary | ICD-10-CM | POA: Diagnosis not present

## 2016-02-23 DIAGNOSIS — N401 Enlarged prostate with lower urinary tract symptoms: Secondary | ICD-10-CM | POA: Diagnosis not present

## 2016-03-13 DIAGNOSIS — H401123 Primary open-angle glaucoma, left eye, severe stage: Secondary | ICD-10-CM | POA: Diagnosis not present

## 2016-07-28 DIAGNOSIS — J069 Acute upper respiratory infection, unspecified: Secondary | ICD-10-CM | POA: Diagnosis not present

## 2016-07-30 DIAGNOSIS — R042 Hemoptysis: Secondary | ICD-10-CM | POA: Diagnosis not present

## 2016-09-10 DIAGNOSIS — H2511 Age-related nuclear cataract, right eye: Secondary | ICD-10-CM | POA: Diagnosis not present

## 2016-09-10 DIAGNOSIS — H401123 Primary open-angle glaucoma, left eye, severe stage: Secondary | ICD-10-CM | POA: Diagnosis not present

## 2017-01-24 DIAGNOSIS — R3914 Feeling of incomplete bladder emptying: Secondary | ICD-10-CM | POA: Diagnosis not present

## 2017-01-24 DIAGNOSIS — N5201 Erectile dysfunction due to arterial insufficiency: Secondary | ICD-10-CM | POA: Diagnosis not present

## 2017-01-24 DIAGNOSIS — R350 Frequency of micturition: Secondary | ICD-10-CM | POA: Diagnosis not present

## 2017-01-24 DIAGNOSIS — N401 Enlarged prostate with lower urinary tract symptoms: Secondary | ICD-10-CM | POA: Diagnosis not present

## 2017-01-31 DIAGNOSIS — D649 Anemia, unspecified: Secondary | ICD-10-CM | POA: Diagnosis not present

## 2017-01-31 DIAGNOSIS — D72819 Decreased white blood cell count, unspecified: Secondary | ICD-10-CM | POA: Diagnosis not present

## 2017-01-31 DIAGNOSIS — E7849 Other hyperlipidemia: Secondary | ICD-10-CM | POA: Diagnosis not present

## 2017-01-31 DIAGNOSIS — Z0000 Encounter for general adult medical examination without abnormal findings: Secondary | ICD-10-CM | POA: Diagnosis not present

## 2017-02-07 DIAGNOSIS — Z87898 Personal history of other specified conditions: Secondary | ICD-10-CM | POA: Diagnosis not present

## 2017-02-07 DIAGNOSIS — E785 Hyperlipidemia, unspecified: Secondary | ICD-10-CM | POA: Diagnosis not present

## 2017-02-07 DIAGNOSIS — N400 Enlarged prostate without lower urinary tract symptoms: Secondary | ICD-10-CM | POA: Diagnosis not present

## 2017-02-07 DIAGNOSIS — Z0000 Encounter for general adult medical examination without abnormal findings: Secondary | ICD-10-CM | POA: Diagnosis not present

## 2017-02-07 DIAGNOSIS — Z23 Encounter for immunization: Secondary | ICD-10-CM | POA: Diagnosis not present

## 2017-02-07 DIAGNOSIS — D649 Anemia, unspecified: Secondary | ICD-10-CM | POA: Diagnosis not present

## 2017-02-07 DIAGNOSIS — D72818 Other decreased white blood cell count: Secondary | ICD-10-CM | POA: Diagnosis not present

## 2017-03-11 DIAGNOSIS — H2511 Age-related nuclear cataract, right eye: Secondary | ICD-10-CM | POA: Diagnosis not present

## 2017-04-23 DIAGNOSIS — R202 Paresthesia of skin: Secondary | ICD-10-CM | POA: Diagnosis not present

## 2017-04-23 DIAGNOSIS — R200 Anesthesia of skin: Secondary | ICD-10-CM | POA: Diagnosis not present

## 2017-04-30 DIAGNOSIS — H2513 Age-related nuclear cataract, bilateral: Secondary | ICD-10-CM | POA: Diagnosis not present

## 2017-06-05 DIAGNOSIS — H2511 Age-related nuclear cataract, right eye: Secondary | ICD-10-CM | POA: Diagnosis not present

## 2017-06-13 ENCOUNTER — Encounter: Payer: Self-pay | Admitting: *Deleted

## 2017-06-13 ENCOUNTER — Other Ambulatory Visit: Payer: Self-pay

## 2017-06-17 NOTE — Discharge Instructions (Signed)

## 2017-06-18 ENCOUNTER — Ambulatory Visit: Payer: Medicare HMO | Admitting: Anesthesiology

## 2017-06-18 ENCOUNTER — Encounter
Admission: RE | Disposition: A | Discharge status: Home / Self Care | Payer: Self-pay | Source: Ambulatory Visit | Attending: Ophthalmology

## 2017-06-18 ENCOUNTER — Ambulatory Visit
Admission: RE | Admit: 2017-06-18 | Discharge: 2017-06-18 | Disposition: A | Discharge status: Home / Self Care | Payer: Medicare HMO | Source: Ambulatory Visit | Attending: Ophthalmology | Admitting: Ophthalmology

## 2017-06-18 DIAGNOSIS — Z7982 Long term (current) use of aspirin: Secondary | ICD-10-CM | POA: Insufficient documentation

## 2017-06-18 DIAGNOSIS — Z87891 Personal history of nicotine dependence: Secondary | ICD-10-CM | POA: Diagnosis not present

## 2017-06-18 DIAGNOSIS — Z79899 Other long term (current) drug therapy: Secondary | ICD-10-CM | POA: Diagnosis not present

## 2017-06-18 DIAGNOSIS — H2511 Age-related nuclear cataract, right eye: Secondary | ICD-10-CM | POA: Insufficient documentation

## 2017-06-18 DIAGNOSIS — H5703 Miosis: Secondary | ICD-10-CM | POA: Diagnosis not present

## 2017-06-18 HISTORY — PX: CATARACT EXTRACTION W/PHACO: SHX586

## 2017-06-18 HISTORY — DX: Motion sickness, initial encounter: T75.3XXA

## 2017-06-18 SURGERY — PHACOEMULSIFICATION, CATARACT, WITH IOL INSERTION
Anesthesia: Monitor Anesthesia Care | Site: Eye | Laterality: Right | Wound class: Clean

## 2017-06-18 MED ORDER — OXYCODONE HCL 5 MG/5ML PO SOLN: 5.0000 mg | Freq: Once | ORAL | Status: DC | PRN

## 2017-06-18 MED ORDER — EPINEPHRINE PF 1 MG/ML IJ SOLN
INTRAOCULAR | Status: DC | PRN
  Administered 2017-06-18: 90 mL via OPHTHALMIC

## 2017-06-18 MED ORDER — NA HYALUR & NA CHOND-NA HYALUR 0.4-0.35 ML IO KIT
PACK | INTRAOCULAR | Status: DC | PRN
  Administered 2017-06-18: 1 mL via INTRAOCULAR

## 2017-06-18 MED ORDER — CEFUROXIME OPHTHALMIC INJECTION 1 MG/0.1 ML
INJECTION | OPHTHALMIC | Status: DC | PRN
  Administered 2017-06-18: 0.1 mL via INTRACAMERAL

## 2017-06-18 MED ORDER — LIDOCAINE HCL (PF) 2 % IJ SOLN
INTRAOCULAR | Status: DC | PRN
  Administered 2017-06-18: 1 mL

## 2017-06-18 MED ORDER — OXYCODONE HCL 5 MG PO TABS: 5.0000 mg | ORAL_TABLET | Freq: Once | ORAL | Status: DC | PRN

## 2017-06-18 MED ORDER — MOXIFLOXACIN HCL 0.5 % OP SOLN
1.0000 [drp] | OPHTHALMIC | Status: DC | PRN
  Administered 2017-06-18 (×3): 1 [drp] via OPHTHALMIC

## 2017-06-18 MED ORDER — LACTATED RINGERS IV SOLN: INTRAVENOUS | Status: DC

## 2017-06-18 MED ORDER — BRIMONIDINE TARTRATE-TIMOLOL 0.2-0.5 % OP SOLN
OPHTHALMIC | Status: DC | PRN
  Administered 2017-06-18: 1 [drp] via OPHTHALMIC

## 2017-06-18 MED ORDER — FENTANYL CITRATE (PF) 100 MCG/2ML IJ SOLN
INTRAMUSCULAR | Status: DC | PRN
  Administered 2017-06-18: 100 ug via INTRAVENOUS

## 2017-06-18 MED ORDER — MIDAZOLAM HCL 2 MG/2ML IJ SOLN
INTRAMUSCULAR | Status: DC | PRN
  Administered 2017-06-18: 2 mg via INTRAVENOUS

## 2017-06-18 MED ORDER — ARMC OPHTHALMIC DILATING DROPS
1.0000 "application " | OPHTHALMIC | Status: DC | PRN
  Administered 2017-06-18 (×3): 1 via OPHTHALMIC

## 2017-06-18 SURGICAL SUPPLY — 25 items
CANNULA ANT/CHMB 27GA (MISCELLANEOUS) ×3 IMPLANT
CARTRIDGE ABBOTT (MISCELLANEOUS) IMPLANT
GLOVE SURG LX 7.5 STRW (GLOVE) ×2
GLOVE SURG LX STRL 7.5 STRW (GLOVE) ×1 IMPLANT
GLOVE SURG TRIUMPH 8.0 PF LTX (GLOVE) ×3 IMPLANT
GOWN STRL REUS W/ TWL LRG LVL3 (GOWN DISPOSABLE) ×2 IMPLANT
GOWN STRL REUS W/TWL LRG LVL3 (GOWN DISPOSABLE) ×4
LENS IOL TECNIS ITEC 19.0 (Intraocular Lens) ×3 IMPLANT
MARKER SKIN DUAL TIP RULER LAB (MISCELLANEOUS) ×3 IMPLANT
NDL RETROBULBAR .5 NSTRL (NEEDLE) IMPLANT
NEEDLE FILTER BLUNT 18X 1/2SAF (NEEDLE) ×2
NEEDLE FILTER BLUNT 18X1 1/2 (NEEDLE) ×1 IMPLANT
PACK CATARACT BRASINGTON (MISCELLANEOUS) ×3 IMPLANT
PACK EYE AFTER SURG (MISCELLANEOUS) ×3 IMPLANT
PACK OPTHALMIC (MISCELLANEOUS) ×3 IMPLANT
RING MALYGIN 7.0 (MISCELLANEOUS) ×3 IMPLANT
SUT ETHILON 10-0 CS-B-6CS-B-6 (SUTURE)
SUT VICRYL  9 0 (SUTURE)
SUT VICRYL 9 0 (SUTURE) IMPLANT
SUTURE EHLN 10-0 CS-B-6CS-B-6 (SUTURE) IMPLANT
SYR 3ML LL SCALE MARK (SYRINGE) ×3 IMPLANT
SYR 5ML LL (SYRINGE) ×3 IMPLANT
SYR TB 1ML LUER SLIP (SYRINGE) ×3 IMPLANT
WATER STERILE IRR 500ML POUR (IV SOLUTION) ×3 IMPLANT
WIPE NON LINTING 3.25X3.25 (MISCELLANEOUS) ×3 IMPLANT

## 2017-06-18 NOTE — Transfer of Care (Signed)
Immediate Anesthesia Transfer of Care Note  Patient: Andrew Rice  Procedure(s) Performed: CATARACT EXTRACTION PHACO AND INTRAOCULAR LENS PLACEMENT (IOC) COMPLICATED RIGHT (Right Eye)  Patient Location: PACU  Anesthesia Type: MAC  Level of Consciousness: awake, alert  and patient cooperative  Airway and Oxygen Therapy: Patient Spontanous Breathing and Patient connected to supplemental oxygen  Post-op Assessment: Post-op Vital signs reviewed, Patient's Cardiovascular Status Stable, Respiratory Function Stable, Patent Airway and No signs of Nausea or vomiting  Post-op Vital Signs: Reviewed and stable  Complications: No apparent anesthesia complications

## 2017-06-18 NOTE — Anesthesia Postprocedure Evaluation (Signed)
Anesthesia Post Note  Patient: Andrew Rice  Procedure(s) Performed: CATARACT EXTRACTION PHACO AND INTRAOCULAR LENS PLACEMENT (IOC) COMPLICATED RIGHT (Right Eye)  Patient location during evaluation: PACU Anesthesia Type: MAC Level of consciousness: awake and alert Pain management: pain level controlled Vital Signs Assessment: post-procedure vital signs reviewed and stable Respiratory status: spontaneous breathing, nonlabored ventilation, respiratory function stable and patient connected to nasal cannula oxygen Cardiovascular status: stable and blood pressure returned to baseline Postop Assessment: no apparent nausea or vomiting Anesthetic complications: no    Mandi Mattioli C

## 2017-06-18 NOTE — Op Note (Signed)
OPERATIVE NOTE  Andrew Rice [id number] 06/18/2017   PREOPERATIVE DIAGNOSIS:    Nuclear Sclerotic Cataract Right eye with miotic pupil.        H25.11  POSTOPERATIVE DIAGNOSIS: Nuclear Sclerotic Cataract Right eye with miotic pupil.          PROCEDURE:  Phacoemusification with posterior chamber intraocular lens placement of the right eye which required pupil stretching with the Malyugin pupil expansion device.  LENS:   Implant Name Type Inv. Item Serial No. Manufacturer Lot No. LRB No. Used  LENS IOL DIOP 19.0 - [id number] Intraocular Lens LENS IOL DIOP 19.0 [id number]  Right 1       ULTRASOUND TIME: 15 % of 2 minutes 42 seconds, CDE 24.6  SURGEON:  Wyonia Hough, MD   ANESTHESIA:  Topical with tetracaine drops and 2% Xylocaine jelly, augmented with 1% preservative-free intracameral lidocaine.   COMPLICATIONS:  None.   DESCRIPTION OF PROCEDURE:  The patient was identified in the holding room and transported to the operating room and placed in the supine position under the operating microscope. Theright eye was identified as the operative eye and it was prepped and draped in the usual sterile ophthalmic fashion.   A 1 millimeter clear-corneal paracentesis was made at the 12:00 position.  0.5 ml of preservative-free 1% lidocaine was injected into the anterior chamber. The anterior chamber was filled with Viscoat viscoelastic.  A 2.4 millimeter keratome was used to make a near-clear corneal incision at the 9:00 position. A Malyugin pupil expander was then placed through the main incision and into the anterior chamber of the eye.  The edge of the iris was secured on the lip of the pupil expander and it was released, thereby expanding the pupil to approximately 7 millimeters for completion of the cataract surgery.  Additional Viscoat was placed in the anterior chamber.  A cystotome and capsulorrhexis forceps were used to make a curvilinear capsulorrhexis.   Balanced salt solution  was used to hydrodissect and hydrodelineate the lens nucleus.   Phacoemulsification was used in stop and chop fashion to remove the lens, nucleus and epinucleus.  The remaining cortex was aspirated using the irrigation aspiration handpiece.  Additional Provisc was placed into the eye to distend the capsular bag for lens placement.  A lens was then injected into the capsular bag.  The pupil expanding ring was removed using a Kuglen hook and insertion device. The remaining viscoelastic was aspirated from the capsular bag and the anterior chamber.  The anterior chamber was filled with balanced salt solution to inflate to a physiologic pressure.  Wounds were hydrated with balanced salt solution.  The anterior chamber was inflated to a physiologic pressure with balanced salt solution.  No wound leaks were noted.Cefuroxime 0.1 ml of a 10mg /ml solution was injected into the anterior chamber for a dose of 1 mg of intracameral antibiotic at the completion of the case. Timolol and Brimonidine drops were applied to the eye.  The patient was taken to the recovery room in stable condition without complications of anesthesia or surgery.  [name] 06/18/2017, 11:47 AM

## 2017-06-18 NOTE — Anesthesia Preprocedure Evaluation (Addendum)
Anesthesia Evaluation  Patient identified by MRN, date of birth, ID band Patient awake    Reviewed: Allergy & Precautions, NPO status , Patient's Chart, lab work & pertinent test results  History of Anesthesia Complications (+) PONV  Airway Mallampati: II  TM Distance: >3 FB Neck ROM: Full    Dental no notable dental hx.    Pulmonary neg pulmonary ROS, former smoker,    Pulmonary exam normal breath sounds clear to auscultation       Cardiovascular Normal cardiovascular exam+ dysrhythmias  Rhythm:Regular Rate:Normal     Neuro/Psych negative neurological ROS  negative psych ROS   GI/Hepatic negative GI ROS, Neg liver ROS,   Endo/Other  negative endocrine ROS  Renal/GU negative Renal ROS  negative genitourinary   Musculoskeletal negative musculoskeletal ROS (+)   Abdominal   Peds negative pediatric ROS (+)  Hematology negative hematology ROS (+)   Anesthesia Other Findings   Reproductive/Obstetrics negative OB ROS                             Anesthesia Physical Anesthesia Plan  ASA: II  Anesthesia Plan: MAC   Post-op Pain Management:    Induction: Intravenous  PONV Risk Score and Plan: TIVA  Airway Management Planned: Natural Airway  Additional Equipment:   Intra-op Plan:   Post-operative Plan: Extubation in OR  Informed Consent: I have reviewed the patients History and Physical, chart, labs and discussed the procedure including the risks, benefits and alternatives for the proposed anesthesia with the patient or authorized representative who has indicated his/her understanding and acceptance.   Dental advisory given  Plan Discussed with: CRNA  Anesthesia Plan Comments:        Anesthesia Quick Evaluation

## 2017-06-18 NOTE — H&P (Signed)
The History and Physical notes are on paper, have been signed, and are to be scanned. The patient remains stable and unchanged from the H&P.   Previous H&P reviewed, patient examined, and there are no changes.  Quindell Shere 06/18/2017 10:20 AM

## 2017-06-18 NOTE — Anesthesia Procedure Notes (Signed)
Procedure Name: MAC Performed by: Ary Rudnick, CRNA Pre-anesthesia Checklist: Patient identified, Emergency Drugs available, Suction available, Timeout performed and Patient being monitored Patient Re-evaluated:Patient Re-evaluated prior to induction Oxygen Delivery Method: Nasal cannula Placement Confirmation: positive ETCO2       

## 2017-06-27 DIAGNOSIS — H43811 Vitreous degeneration, right eye: Secondary | ICD-10-CM | POA: Diagnosis not present

## 2017-08-09 DIAGNOSIS — Z961 Presence of intraocular lens: Secondary | ICD-10-CM | POA: Diagnosis not present

## 2017-10-29 DIAGNOSIS — H401123 Primary open-angle glaucoma, left eye, severe stage: Secondary | ICD-10-CM | POA: Diagnosis not present

## 2017-12-11 DIAGNOSIS — Z961 Presence of intraocular lens: Secondary | ICD-10-CM | POA: Diagnosis not present

## 2018-02-03 DIAGNOSIS — N400 Enlarged prostate without lower urinary tract symptoms: Secondary | ICD-10-CM | POA: Diagnosis not present

## 2018-02-03 DIAGNOSIS — E785 Hyperlipidemia, unspecified: Secondary | ICD-10-CM | POA: Diagnosis not present

## 2018-02-03 DIAGNOSIS — D72818 Other decreased white blood cell count: Secondary | ICD-10-CM | POA: Diagnosis not present

## 2018-02-03 DIAGNOSIS — Z125 Encounter for screening for malignant neoplasm of prostate: Secondary | ICD-10-CM | POA: Diagnosis not present

## 2018-02-03 DIAGNOSIS — D649 Anemia, unspecified: Secondary | ICD-10-CM | POA: Diagnosis not present

## 2018-02-04 DIAGNOSIS — R350 Frequency of micturition: Secondary | ICD-10-CM | POA: Diagnosis not present

## 2018-02-04 DIAGNOSIS — N5201 Erectile dysfunction due to arterial insufficiency: Secondary | ICD-10-CM | POA: Diagnosis not present

## 2018-02-04 DIAGNOSIS — R3914 Feeling of incomplete bladder emptying: Secondary | ICD-10-CM | POA: Diagnosis not present

## 2018-02-04 DIAGNOSIS — N401 Enlarged prostate with lower urinary tract symptoms: Secondary | ICD-10-CM | POA: Diagnosis not present

## 2018-02-10 DIAGNOSIS — N400 Enlarged prostate without lower urinary tract symptoms: Secondary | ICD-10-CM | POA: Diagnosis not present

## 2018-02-10 DIAGNOSIS — Z125 Encounter for screening for malignant neoplasm of prostate: Secondary | ICD-10-CM | POA: Diagnosis not present

## 2018-02-10 DIAGNOSIS — Z0000 Encounter for general adult medical examination without abnormal findings: Secondary | ICD-10-CM | POA: Diagnosis not present

## 2018-02-10 DIAGNOSIS — E785 Hyperlipidemia, unspecified: Secondary | ICD-10-CM | POA: Diagnosis not present

## 2018-02-10 DIAGNOSIS — D649 Anemia, unspecified: Secondary | ICD-10-CM | POA: Diagnosis not present

## 2018-02-10 DIAGNOSIS — Z87898 Personal history of other specified conditions: Secondary | ICD-10-CM | POA: Diagnosis not present

## 2018-06-18 DIAGNOSIS — H401123 Primary open-angle glaucoma, left eye, severe stage: Secondary | ICD-10-CM | POA: Diagnosis not present

## 2018-11-10 DIAGNOSIS — R21 Rash and other nonspecific skin eruption: Secondary | ICD-10-CM | POA: Diagnosis not present

## 2018-12-17 DIAGNOSIS — Z20828 Contact with and (suspected) exposure to other viral communicable diseases: Secondary | ICD-10-CM | POA: Diagnosis not present

## 2018-12-17 DIAGNOSIS — H401123 Primary open-angle glaucoma, left eye, severe stage: Secondary | ICD-10-CM | POA: Diagnosis not present

## 2018-12-24 DIAGNOSIS — H401123 Primary open-angle glaucoma, left eye, severe stage: Secondary | ICD-10-CM | POA: Diagnosis not present

## 2019-01-27 DIAGNOSIS — N5201 Erectile dysfunction due to arterial insufficiency: Secondary | ICD-10-CM | POA: Diagnosis not present

## 2019-01-27 DIAGNOSIS — N401 Enlarged prostate with lower urinary tract symptoms: Secondary | ICD-10-CM | POA: Diagnosis not present

## 2019-01-27 DIAGNOSIS — Z125 Encounter for screening for malignant neoplasm of prostate: Secondary | ICD-10-CM | POA: Diagnosis not present

## 2019-01-29 DIAGNOSIS — Z125 Encounter for screening for malignant neoplasm of prostate: Secondary | ICD-10-CM | POA: Diagnosis not present

## 2019-01-29 DIAGNOSIS — E785 Hyperlipidemia, unspecified: Secondary | ICD-10-CM | POA: Diagnosis not present

## 2019-01-29 DIAGNOSIS — E349 Endocrine disorder, unspecified: Secondary | ICD-10-CM | POA: Diagnosis not present

## 2019-01-29 DIAGNOSIS — D649 Anemia, unspecified: Secondary | ICD-10-CM | POA: Diagnosis not present

## 2019-02-13 DIAGNOSIS — N400 Enlarged prostate without lower urinary tract symptoms: Secondary | ICD-10-CM | POA: Diagnosis not present

## 2019-02-13 DIAGNOSIS — Z1331 Encounter for screening for depression: Secondary | ICD-10-CM | POA: Diagnosis not present

## 2019-02-13 DIAGNOSIS — Z0000 Encounter for general adult medical examination without abnormal findings: Secondary | ICD-10-CM | POA: Diagnosis not present

## 2019-02-13 DIAGNOSIS — D72819 Decreased white blood cell count, unspecified: Secondary | ICD-10-CM | POA: Diagnosis not present

## 2019-02-13 DIAGNOSIS — D649 Anemia, unspecified: Secondary | ICD-10-CM | POA: Diagnosis not present

## 2019-02-13 DIAGNOSIS — Z87891 Personal history of nicotine dependence: Secondary | ICD-10-CM | POA: Diagnosis not present

## 2019-02-13 DIAGNOSIS — M1990 Unspecified osteoarthritis, unspecified site: Secondary | ICD-10-CM | POA: Diagnosis not present

## 2019-03-09 DIAGNOSIS — H9319 Tinnitus, unspecified ear: Secondary | ICD-10-CM | POA: Diagnosis not present

## 2019-03-09 DIAGNOSIS — H903 Sensorineural hearing loss, bilateral: Secondary | ICD-10-CM | POA: Diagnosis not present

## 2019-05-05 DIAGNOSIS — S61001A Unspecified open wound of right thumb without damage to nail, initial encounter: Secondary | ICD-10-CM | POA: Diagnosis not present

## 2019-05-14 DIAGNOSIS — S61001D Unspecified open wound of right thumb without damage to nail, subsequent encounter: Secondary | ICD-10-CM | POA: Diagnosis not present

## 2019-06-04 DIAGNOSIS — L918 Other hypertrophic disorders of the skin: Secondary | ICD-10-CM | POA: Diagnosis not present

## 2019-06-04 DIAGNOSIS — L814 Other melanin hyperpigmentation: Secondary | ICD-10-CM | POA: Diagnosis not present

## 2019-06-04 DIAGNOSIS — D2261 Melanocytic nevi of right upper limb, including shoulder: Secondary | ICD-10-CM | POA: Diagnosis not present

## 2019-06-04 DIAGNOSIS — Z86018 Personal history of other benign neoplasm: Secondary | ICD-10-CM

## 2019-06-04 DIAGNOSIS — Z1283 Encounter for screening for malignant neoplasm of skin: Secondary | ICD-10-CM | POA: Diagnosis not present

## 2019-06-04 DIAGNOSIS — L738 Other specified follicular disorders: Secondary | ICD-10-CM | POA: Diagnosis not present

## 2019-06-04 DIAGNOSIS — L578 Other skin changes due to chronic exposure to nonionizing radiation: Secondary | ICD-10-CM | POA: Diagnosis not present

## 2019-06-04 DIAGNOSIS — D1801 Hemangioma of skin and subcutaneous tissue: Secondary | ICD-10-CM | POA: Diagnosis not present

## 2019-06-04 DIAGNOSIS — D239 Other benign neoplasm of skin, unspecified: Secondary | ICD-10-CM

## 2019-06-04 DIAGNOSIS — L719 Rosacea, unspecified: Secondary | ICD-10-CM | POA: Diagnosis not present

## 2019-06-04 DIAGNOSIS — D225 Melanocytic nevi of trunk: Secondary | ICD-10-CM | POA: Diagnosis not present

## 2019-06-04 HISTORY — DX: Other benign neoplasm of skin, unspecified: D23.9

## 2019-06-04 HISTORY — DX: Personal history of other benign neoplasm: Z86.018

## 2019-06-24 DIAGNOSIS — H401123 Primary open-angle glaucoma, left eye, severe stage: Secondary | ICD-10-CM | POA: Diagnosis not present

## 2019-08-04 ENCOUNTER — Telehealth: Payer: Self-pay

## 2019-08-04 ENCOUNTER — Encounter: Payer: Self-pay | Admitting: Dermatology

## 2019-08-04 ENCOUNTER — Ambulatory Visit (INDEPENDENT_AMBULATORY_CARE_PROVIDER_SITE_OTHER): Payer: Medicare HMO | Admitting: Dermatology

## 2019-08-04 ENCOUNTER — Other Ambulatory Visit: Payer: Self-pay

## 2019-08-04 ENCOUNTER — Other Ambulatory Visit: Payer: Self-pay | Admitting: Dermatology

## 2019-08-04 DIAGNOSIS — Z86018 Personal history of other benign neoplasm: Secondary | ICD-10-CM

## 2019-08-04 DIAGNOSIS — D239 Other benign neoplasm of skin, unspecified: Secondary | ICD-10-CM

## 2019-08-04 DIAGNOSIS — D2261 Melanocytic nevi of right upper limb, including shoulder: Secondary | ICD-10-CM | POA: Diagnosis not present

## 2019-08-04 DIAGNOSIS — L814 Other melanin hyperpigmentation: Secondary | ICD-10-CM | POA: Diagnosis not present

## 2019-08-04 MED ORDER — MUPIROCIN 2 % EX OINT
1.0000 | TOPICAL_OINTMENT | Freq: Every day | CUTANEOUS | 0 refills | Status: DC
Start: 2019-08-04 — End: 2022-11-28

## 2019-08-04 NOTE — Telephone Encounter (Signed)
Pt doing well after todays surgery./sh 

## 2019-08-04 NOTE — Patient Instructions (Signed)

## 2019-08-04 NOTE — Progress Notes (Signed)
   Follow-Up Visit   Subjective  Andrew Rice is a 74 y.o. male who presents for the following: Moderate to severe dysplastic nevus bx proven (R bicep, bx 06/04/19, pt presents for excision today) and Dysplastic Nevus bx proven, moderate atypia ( right inf pectoral just inf to aerola, moderate atypia bx 06/04/19).   The following portions of the chart were reviewed this encounter and updated as appropriate:  Tobacco  Allergies  Meds  Problems  Med Hx  Surg Hx  Fam Hx      Review of Systems:  No other skin or systemic complaints except as noted in HPI or Assessment and Plan.  Objective  Well appearing patient in no apparent distress; mood and affect are within normal limits.  A focused examination was performed including right bicep, chest. Relevant physical exam findings are noted in the Assessment and Plan.  Objective  Right bicep: Pink bx site 0.5cm  Objective  R inf pectoral, just inferior to aerola: Pink bx site, clear    Assessment & Plan  Dysplastic nevus Right bicep  Moderate to Severe  Skin excision - Right bicep  Lesion length (cm):  0.5 Lesion width (cm):  0.5 Margin per side (cm):  0.2 Total excision diameter (cm):  0.9 Informed consent: discussed and consent obtained   Timeout: patient name, date of birth, surgical site, and procedure verified   Procedure prep:  Patient was prepped and draped in usual sterile fashion Prep type:  Isopropyl alcohol and povidone-iodine Anesthesia: the lesion was anesthetized in a standard fashion   Anesthetic:  1% lidocaine w/ epinephrine 1-100,000 buffered w/ 8.4% NaHCO3 (10.0cc) Instrument used: #15 blade   Hemostasis achieved with: pressure   Hemostasis achieved with comment:  Electrocautery Outcome: patient tolerated procedure well with no complications   Post-procedure details: sterile dressing applied and wound care instructions given   Dressing type: bandage and pressure dressing (Mupirocin)    Skin  repair - Right bicep Complexity:  Complex Final length (cm):  3 Reason for type of repair: reduce tension to allow closure, reduce the risk of dehiscence, infection, and necrosis, reduce subcutaneous dead space and avoid a hematoma, allow closure of the large defect, preserve normal anatomy, preserve normal anatomical and functional relationships and enhance both functionality and cosmetic results   Undermining: area extensively undermined   Undermining comment:  Undermining Defect 1.2 cm Subcutaneous layers (deep stitches):  Suture size:  3-0 Suture type: Vicryl (polyglactin 910)   Subcutaneous suture technique: Inverted Dermal. Fine/surface layer approximation (top stitches):  Suture size:  4-0 Stitches: simple running   Stitches comment:  Nylon Suture removal (days):  7 Hemostasis achieved with: suture and pressure Outcome: patient tolerated procedure well with no complications   Post-procedure details: sterile dressing applied and wound care instructions given   Dressing type: bandage and pressure dressing (Mupirocin)    mupirocin ointment (BACTROBAN) 2 % - Right bicep  Specimen 1 - Surgical pathology Differential Diagnosis: D48.5 Moderate to Severe Dysplastic Nevus Check Margins: yes Pink bx site 0.5cm Previous bx site [id number]  History of dysplastic nevus R inf pectoral, just inferior to aerola  Clear, observe for changes   Return in about 1 week (around 08/11/2019) for sr.    Documentation: I have reviewed the above documentation for accuracy and completeness, and I agree with the above.  Sarina Ser, MD   I, Othelia Pulling, RMA, am acting as scribe for Sarina Ser, MD .

## 2019-08-11 ENCOUNTER — Other Ambulatory Visit: Payer: Self-pay

## 2019-08-11 ENCOUNTER — Ambulatory Visit: Payer: Medicare HMO | Admitting: Dermatology

## 2019-08-11 DIAGNOSIS — Z4802 Encounter for removal of sutures: Secondary | ICD-10-CM

## 2019-08-11 DIAGNOSIS — Z86018 Personal history of other benign neoplasm: Secondary | ICD-10-CM

## 2019-08-12 ENCOUNTER — Encounter: Payer: Self-pay | Admitting: Dermatology

## 2019-08-12 NOTE — Progress Notes (Addendum)
   Follow-Up Visit   Subjective  Andrew Rice is a 74 y.o. male who presents for the following: suture removal (suture removal margins free dysplastic nevus of the R bicep. No issues per patient).  The following portions of the chart were reviewed this encounter and updated as appropriate:  Tobacco  Allergies  Meds  Problems  Med Hx  Surg Hx  Fam Hx      Review of Systems:  No other skin or systemic complaints except as noted in HPI or Assessment and Plan.  Objective  Well appearing patient in no apparent distress; mood and affect are within normal limits.  A focused examination was performed including the right bicep. Relevant physical exam findings are noted in the Assessment and Plan.  Objective  R bicep: Healing excision site  Assessment & Plan  History of dysplastic nevus R bicep  Encounter for Removal of Sutures - Incision site at the R bicep is clean, dry and intact - Wound cleansed, sutures removed, wound cleansed and steri strips applied.  - Discussed pathology results showing margins free dysplastic nevus  - Patient advised to keep steri-strips dry until they fall off. - Scars remodel for a full year. - Once steri-strips fall off, patient can apply over-the-counter silicone scar cream each night to help with scar remodeling if desired. - Patient advised to call with any concerns or if they notice any new or changing lesions.   Return in about 1 year (around 08/10/2020) for TBSE.  Luther Redo, CMA, am acting as scribe for Sarina Ser, MD .  Documentation: I have reviewed the above documentation for accuracy and completeness, and I agree with the above.  Sarina Ser, MD

## 2019-09-08 DIAGNOSIS — N411 Chronic prostatitis: Secondary | ICD-10-CM | POA: Diagnosis not present

## 2019-09-08 DIAGNOSIS — N5201 Erectile dysfunction due to arterial insufficiency: Secondary | ICD-10-CM | POA: Diagnosis not present

## 2019-09-08 DIAGNOSIS — N401 Enlarged prostate with lower urinary tract symptoms: Secondary | ICD-10-CM | POA: Diagnosis not present

## 2019-11-28 DIAGNOSIS — J018 Other acute sinusitis: Secondary | ICD-10-CM | POA: Diagnosis not present

## 2019-11-28 DIAGNOSIS — Z20822 Contact with and (suspected) exposure to covid-19: Secondary | ICD-10-CM | POA: Diagnosis not present

## 2019-12-01 DIAGNOSIS — J209 Acute bronchitis, unspecified: Secondary | ICD-10-CM | POA: Diagnosis not present

## 2019-12-08 DIAGNOSIS — Z20822 Contact with and (suspected) exposure to covid-19: Secondary | ICD-10-CM | POA: Diagnosis not present

## 2019-12-22 DIAGNOSIS — H401123 Primary open-angle glaucoma, left eye, severe stage: Secondary | ICD-10-CM | POA: Diagnosis not present

## 2019-12-29 DIAGNOSIS — H401123 Primary open-angle glaucoma, left eye, severe stage: Secondary | ICD-10-CM | POA: Diagnosis not present

## 2020-02-08 DIAGNOSIS — E7800 Pure hypercholesterolemia, unspecified: Secondary | ICD-10-CM | POA: Diagnosis not present

## 2020-02-08 DIAGNOSIS — D72819 Decreased white blood cell count, unspecified: Secondary | ICD-10-CM | POA: Diagnosis not present

## 2020-02-08 DIAGNOSIS — N400 Enlarged prostate without lower urinary tract symptoms: Secondary | ICD-10-CM | POA: Diagnosis not present

## 2020-02-08 DIAGNOSIS — Z125 Encounter for screening for malignant neoplasm of prostate: Secondary | ICD-10-CM | POA: Diagnosis not present

## 2020-02-15 DIAGNOSIS — E7800 Pure hypercholesterolemia, unspecified: Secondary | ICD-10-CM | POA: Diagnosis not present

## 2020-02-15 DIAGNOSIS — N400 Enlarged prostate without lower urinary tract symptoms: Secondary | ICD-10-CM | POA: Diagnosis not present

## 2020-02-15 DIAGNOSIS — D649 Anemia, unspecified: Secondary | ICD-10-CM | POA: Diagnosis not present

## 2020-02-15 DIAGNOSIS — Z0000 Encounter for general adult medical examination without abnormal findings: Secondary | ICD-10-CM | POA: Diagnosis not present

## 2020-02-15 DIAGNOSIS — Z125 Encounter for screening for malignant neoplasm of prostate: Secondary | ICD-10-CM | POA: Diagnosis not present

## 2020-02-15 DIAGNOSIS — L989 Disorder of the skin and subcutaneous tissue, unspecified: Secondary | ICD-10-CM | POA: Diagnosis not present

## 2020-02-15 DIAGNOSIS — Z23 Encounter for immunization: Secondary | ICD-10-CM | POA: Diagnosis not present

## 2020-02-22 DIAGNOSIS — N5201 Erectile dysfunction due to arterial insufficiency: Secondary | ICD-10-CM | POA: Diagnosis not present

## 2020-02-22 DIAGNOSIS — N401 Enlarged prostate with lower urinary tract symptoms: Secondary | ICD-10-CM | POA: Diagnosis not present

## 2020-03-21 ENCOUNTER — Other Ambulatory Visit: Payer: Self-pay

## 2020-03-21 ENCOUNTER — Ambulatory Visit: Payer: Medicare HMO | Admitting: Dermatology

## 2020-03-21 DIAGNOSIS — C44729 Squamous cell carcinoma of skin of left lower limb, including hip: Secondary | ICD-10-CM

## 2020-03-21 DIAGNOSIS — C4492 Squamous cell carcinoma of skin, unspecified: Secondary | ICD-10-CM

## 2020-03-21 DIAGNOSIS — L578 Other skin changes due to chronic exposure to nonionizing radiation: Secondary | ICD-10-CM | POA: Diagnosis not present

## 2020-03-21 DIAGNOSIS — D485 Neoplasm of uncertain behavior of skin: Secondary | ICD-10-CM

## 2020-03-21 HISTORY — DX: Squamous cell carcinoma of skin, unspecified: C44.92

## 2020-03-21 NOTE — Patient Instructions (Signed)

## 2020-03-21 NOTE — Progress Notes (Signed)
    Follow-Up Visit   Subjective  Andrew Rice is a 74 y.o. male who presents for the following: Other (Lesion - L leg has been present for 6 + months, irregular, crusted, will not resolve).  The following portions of the chart were reviewed this encounter and updated as appropriate:   Tobacco  Allergies  Meds  Problems  Med Hx  Surg Hx  Fam Hx     Review of Systems:  No other skin or systemic complaints except as noted in HPI or Assessment and Plan.  Objective  Well appearing patient in no apparent distress; mood and affect are within normal limits.  A focused examination was performed including face, neck, chest and back and the left leg. Relevant physical exam findings are noted in the Assessment and Plan.  Objective  Left Lower Leg - Anterior: 1.5 cm hyperkeratotic papule   Assessment & Plan  Neoplasm of uncertain behavior of skin Left Lower Leg - Anterior  Epidermal / dermal shaving  Lesion diameter (cm):  1.5 Informed consent: discussed and consent obtained   Timeout: patient name, date of birth, surgical site, and procedure verified   Procedure prep:  Patient was prepped and draped in usual sterile fashion Prep type:  Isopropyl alcohol Anesthesia: the lesion was anesthetized in a standard fashion   Anesthetic:  1% lidocaine w/ epinephrine 1-100,000 buffered w/ 8.4% NaHCO3 Instrument used: flexible razor blade   Hemostasis achieved with: pressure, aluminum chloride and electrodesiccation   Outcome: patient tolerated procedure well   Post-procedure details: sterile dressing applied and wound care instructions given   Dressing type: bandage and petrolatum    Destruction of lesion Complexity: extensive   Destruction method: electrodesiccation and curettage   Informed consent: discussed and consent obtained   Timeout:  patient name, date of birth, surgical site, and procedure verified Procedure prep:  Patient was prepped and draped in usual sterile  fashion Prep type:  Isopropyl alcohol Anesthesia: the lesion was anesthetized in a standard fashion   Anesthetic:  1% lidocaine w/ epinephrine 1-100,000 buffered w/ 8.4% NaHCO3 Curettage performed in three different directions: Yes   Electrodesiccation performed over the curetted area: Yes   Lesion length (cm):  1.5 Lesion width (cm):  1.5 Margin per side (cm):  0.2 Final wound size (cm):  1.9 Hemostasis achieved with:  pressure, aluminum chloride and electrodesiccation Outcome: patient tolerated procedure well with no complications   Post-procedure details: sterile dressing applied and wound care instructions given   Dressing type: bandage and petrolatum    Specimen 1 - Surgical pathology Differential Diagnosis: D48.5 r/o SCC vs ISK  ED&C today  Check Margins: No 1.5 cm hyperkeratotic papule  SCC (squamous cell carcinoma), leg, left  Actinic Damage - chronic, secondary to cumulative UV radiation exposure/sun exposure over time - diffuse scaly erythematous macules with underlying dyspigmentation - Recommend daily broad spectrum sunscreen SPF 30+ to sun-exposed areas, reapply every 2 hours as needed.  - Call for new or changing lesions.  Return for appointment as scheduled - TBSE .  Luther Redo, CMA, am acting as scribe for Sarina Ser, MD .  Documentation: I have reviewed the above documentation for accuracy and completeness, and I agree with the above.  Sarina Ser, MD

## 2020-03-23 ENCOUNTER — Telehealth: Payer: Self-pay

## 2020-03-23 ENCOUNTER — Encounter: Payer: Self-pay | Admitting: Dermatology

## 2020-03-23 NOTE — Telephone Encounter (Signed)
Patient informed of pathology results 

## 2020-03-23 NOTE — Telephone Encounter (Signed)
-----   Message from Ralene Bathe, MD sent at 03/22/2020  7:15 PM EST ----- Diagnosis Skin , left lower leg-anterior WELL DIFFERENTIATED SQUAMOUS CELL CARCINOMA  Cancer - SCC Already treated Recheck next visit

## 2020-06-06 ENCOUNTER — Other Ambulatory Visit: Payer: Self-pay

## 2020-06-06 ENCOUNTER — Ambulatory Visit: Payer: Medicare HMO | Admitting: Dermatology

## 2020-06-06 ENCOUNTER — Encounter: Payer: Self-pay | Admitting: Dermatology

## 2020-06-06 DIAGNOSIS — L578 Other skin changes due to chronic exposure to nonionizing radiation: Secondary | ICD-10-CM

## 2020-06-06 DIAGNOSIS — L821 Other seborrheic keratosis: Secondary | ICD-10-CM

## 2020-06-06 DIAGNOSIS — L720 Epidermal cyst: Secondary | ICD-10-CM | POA: Diagnosis not present

## 2020-06-06 DIAGNOSIS — L814 Other melanin hyperpigmentation: Secondary | ICD-10-CM | POA: Diagnosis not present

## 2020-06-06 DIAGNOSIS — Z86018 Personal history of other benign neoplasm: Secondary | ICD-10-CM | POA: Diagnosis not present

## 2020-06-06 DIAGNOSIS — Z85828 Personal history of other malignant neoplasm of skin: Secondary | ICD-10-CM | POA: Diagnosis not present

## 2020-06-06 DIAGNOSIS — D1800 Hemangioma unspecified site: Secondary | ICD-10-CM | POA: Diagnosis not present

## 2020-06-06 DIAGNOSIS — D229 Melanocytic nevi, unspecified: Secondary | ICD-10-CM

## 2020-06-06 DIAGNOSIS — Z1283 Encounter for screening for malignant neoplasm of skin: Secondary | ICD-10-CM | POA: Diagnosis not present

## 2020-06-06 NOTE — Progress Notes (Signed)
   Follow-Up Visit   Subjective  Andrew Rice is a 75 y.o. male who presents for the following: Annual Exam (Hx SCC, hx dysplastic nevi). The patient presents for Total-Body Skin Exam (TBSE) for skin cancer screening and mole check.  The following portions of the chart were reviewed this encounter and updated as appropriate:   Tobacco  Allergies  Meds  Problems  Med Hx  Surg Hx  Fam Hx     Review of Systems:  No other skin or systemic complaints except as noted in HPI or Assessment and Plan.  Objective  Well appearing patient in no apparent distress; mood and affect are within normal limits.  A full examination was performed including scalp, head, eyes, ears, nose, lips, neck, chest, axillae, abdomen, back, buttocks, bilateral upper extremities, bilateral lower extremities, hands, feet, fingers, toes, fingernails, and toenails. All findings within normal limits unless otherwise noted below.  Objective  Mid Back (2): Subcutaneous nodules    Assessment & Plan  Epidermal inclusion cyst (2) Mid Back Benign-appearing.  Observation.  Call clinic for new or changing lesions.  Recommend daily use of broad spectrum spf 30+ sunscreen to sun-exposed areas.  Discussed surgical option.   Lentigines - Scattered tan macules - Due to sun exposure - Benign-appering, observe - Recommend daily broad spectrum sunscreen SPF 30+ to sun-exposed areas, reapply every 2 hours as needed. - Call for any changes  Seborrheic Keratoses - Stuck-on, waxy, tan-brown papules and plaques  - Discussed benign etiology and prognosis. - Observe - Call for any changes  Melanocytic Nevi - Tan-brown and/or pink-flesh-colored symmetric macules and papules - Benign appearing on exam today - Observation - Call clinic for new or changing moles - Recommend daily use of broad spectrum spf 30+ sunscreen to sun-exposed areas.   Hemangiomas - Red papules - Discussed benign nature - Observe - Call for any  changes  Actinic Damage - Chronic, secondary to cumulative UV/sun exposure - diffuse scaly erythematous macules with underlying dyspigmentation - Recommend daily broad spectrum sunscreen SPF 30+ to sun-exposed areas, reapply every 2 hours as needed.  - Call for new or changing lesions.  History of Squamous Cell Carcinoma of the Skin - No evidence of recurrence today - No lymphadenopathy - Recommend regular full body skin exams - Recommend daily broad spectrum sunscreen SPF 30+ to sun-exposed areas, reapply every 2 hours as needed.  - Call if any new or changing lesions are noted between office visits  History of Dysplastic Nevi - No evidence of recurrence today - Recommend regular full body skin exams - Recommend daily broad spectrum sunscreen SPF 30+ to sun-exposed areas, reapply every 2 hours as needed.  - Call if any new or changing lesions are noted between office visits  Skin cancer screening performed today.  Return in about 1 year (around 06/06/2021) for TBSE - hx dysplastic nevi .  Luther Redo, CMA, am acting as scribe for Sarina Ser, MD .  Documentation: I have reviewed the above documentation for accuracy and completeness, and I agree with the above.  Sarina Ser, MD

## 2020-06-07 ENCOUNTER — Encounter: Payer: Self-pay | Admitting: Dermatology

## 2020-06-28 DIAGNOSIS — H401123 Primary open-angle glaucoma, left eye, severe stage: Secondary | ICD-10-CM | POA: Diagnosis not present

## 2021-01-25 DIAGNOSIS — H401123 Primary open-angle glaucoma, left eye, severe stage: Secondary | ICD-10-CM | POA: Diagnosis not present

## 2021-02-07 DIAGNOSIS — N400 Enlarged prostate without lower urinary tract symptoms: Secondary | ICD-10-CM | POA: Diagnosis not present

## 2021-02-07 DIAGNOSIS — D649 Anemia, unspecified: Secondary | ICD-10-CM | POA: Diagnosis not present

## 2021-02-07 DIAGNOSIS — Z125 Encounter for screening for malignant neoplasm of prostate: Secondary | ICD-10-CM | POA: Diagnosis not present

## 2021-02-07 DIAGNOSIS — E7800 Pure hypercholesterolemia, unspecified: Secondary | ICD-10-CM | POA: Diagnosis not present

## 2021-02-16 DIAGNOSIS — D72819 Decreased white blood cell count, unspecified: Secondary | ICD-10-CM | POA: Diagnosis not present

## 2021-02-16 DIAGNOSIS — N400 Enlarged prostate without lower urinary tract symptoms: Secondary | ICD-10-CM | POA: Diagnosis not present

## 2021-02-16 DIAGNOSIS — D649 Anemia, unspecified: Secondary | ICD-10-CM | POA: Diagnosis not present

## 2021-02-16 DIAGNOSIS — Z23 Encounter for immunization: Secondary | ICD-10-CM | POA: Diagnosis not present

## 2021-02-16 DIAGNOSIS — Z87898 Personal history of other specified conditions: Secondary | ICD-10-CM | POA: Diagnosis not present

## 2021-02-16 DIAGNOSIS — Z0000 Encounter for general adult medical examination without abnormal findings: Secondary | ICD-10-CM | POA: Diagnosis not present

## 2021-02-16 DIAGNOSIS — E7800 Pure hypercholesterolemia, unspecified: Secondary | ICD-10-CM | POA: Diagnosis not present

## 2021-02-16 DIAGNOSIS — Z125 Encounter for screening for malignant neoplasm of prostate: Secondary | ICD-10-CM | POA: Diagnosis not present

## 2021-02-20 ENCOUNTER — Ambulatory Visit: Payer: Medicare HMO | Admitting: Dermatology

## 2021-02-20 ENCOUNTER — Other Ambulatory Visit: Payer: Self-pay

## 2021-02-20 DIAGNOSIS — C44622 Squamous cell carcinoma of skin of right upper limb, including shoulder: Secondary | ICD-10-CM | POA: Diagnosis not present

## 2021-02-20 DIAGNOSIS — D485 Neoplasm of uncertain behavior of skin: Secondary | ICD-10-CM

## 2021-02-20 DIAGNOSIS — L578 Other skin changes due to chronic exposure to nonionizing radiation: Secondary | ICD-10-CM

## 2021-02-20 NOTE — Patient Instructions (Signed)
Wound Care Instructions  Cleanse wound gently with soap and water once a day then pat dry with clean gauze. Apply a thing coat of Petrolatum (petroleum jelly, "Vaseline") over the wound (unless you have an allergy to this). We recommend that you use a new, sterile tube of Vaseline. Do not pick or remove scabs. Do not remove the yellow or white "healing tissue" from the base of the wound.  Cover the wound with fresh, clean, nonstick gauze and secure with paper tape. You may use Band-Aids in place of gauze and tape if the would is small enough, but would recommend trimming much of the tape off as there is often too much. Sometimes Band-Aids can irritate the skin.  You should call the office for your biopsy report after 1 week if you have not already been contacted.  If you experience any problems, such as abnormal amounts of bleeding, swelling, significant bruising, significant pain, or evidence of infection, please call the office immediately.  FOR ADULT SURGERY PATIENTS: If you need something for pain relief you may take 1 extra strength Tylenol (acetaminophen) AND 2 Ibuprofen (200mg each) together every 4 hours as needed for pain. (do not take these if you are allergic to them or if you have a reason you should not take them.) Typically, you may only need pain medication for 1 to 3 days.   If you have any questions or concerns for your doctor, please call our main line at [phone] and press option 4 to reach your doctor's medical assistant. If no one answers, please leave a voicemail as directed and we will return your call as soon as possible. Messages left after 4 pm will be answered the following business day.   You may also send us a message via MyChart. We typically respond to MyChart messages within 1-2 business days.  For prescription refills, please ask your pharmacy to contact our office. Our fax number is [phone].  If you have an urgent issue when the clinic is closed that  cannot wait until the next business day, you can page your doctor at the number below.    Please note that while we do our best to be available for urgent issues outside of office hours, we are not available 24/7.   If you have an urgent issue and are unable to reach us, you may choose to seek medical care at your doctor's office, retail clinic, urgent care center, or emergency room.  If you have a medical emergency, please immediately call 911 or go to the emergency department.  Pager Numbers  - Dr. Kowalski: [phone]  - Dr. Moye: [phone]  - Dr. Stewart: [phone]  In the event of inclement weather, please call our main line at [phone] for an update on the status of any delays or closures.  Dermatology Medication Tips: Please keep the boxes that topical medications come in in order to help keep track of the instructions about where and how to use these. Pharmacies typically print the medication instructions only on the boxes and not directly on the medication tubes.   If your medication is too expensive, please contact our office at [phone] option 4 or send us a message through MyChart.   We are unable to tell what your co-pay for medications will be in advance as this is different depending on your insurance coverage. However, we may be able to find a substitute medication at lower cost or fill out paperwork to get insurance to cover a needed   medication.   If a prior authorization is required to get your medication covered by your insurance company, please allow us 1-2 business days to complete this process.  Drug prices often vary depending on where the prescription is filled and some pharmacies may offer cheaper prices.  The website www.goodrx.com contains coupons for medications through different pharmacies. The prices here do not account for what the cost may be with help from insurance (it may be cheaper with your insurance), but the website can give you the  price if you did not use any insurance.  - You can print the associated coupon and take it with your prescription to the pharmacy.  - You may also stop by our office during regular business hours and pick up a GoodRx coupon card.  - If you need your prescription sent electronically to a different pharmacy, notify our office through [facility] MyChart or by phone at [phone] option 4.   

## 2021-02-20 NOTE — Progress Notes (Signed)
   Follow-Up Visit   Subjective  Andrew Rice is a 75 y.o. male who presents for the following: Skin Problem (Patient here today for a spot at right wrist, present for several months, has gotten larger. No itching or bleeding. ). He has history of sun damage.  He has other areas to be evaluated today. Patient accompanied by wife.  The following portions of the chart were reviewed this encounter and updated as appropriate:   Tobacco  Allergies  Meds  Problems  Med Hx  Surg Hx  Fam Hx     Review of Systems:  No other skin or systemic complaints except as noted in HPI or Assessment and Plan.  Objective  Well appearing patient in no apparent distress; mood and affect are within normal limits.  A focused examination was performed including right arm, wrist. Relevant physical exam findings are noted in the Assessment and Plan.  Right Volar Distal Forearm/wrist Hyperkeratotic papule 1.1 cm   Assessment & Plan  Neoplasm of uncertain behavior of skin Right Volar Distal Forearm/wrist  Epidermal / dermal shaving  Lesion diameter (cm):  1.1 Informed consent: discussed and consent obtained   Timeout: patient name, date of birth, surgical site, and procedure verified   Procedure prep:  Patient was prepped and draped in usual sterile fashion Prep type:  Isopropyl alcohol Anesthesia: the lesion was anesthetized in a standard fashion   Anesthetic:  1% lidocaine w/ epinephrine 1-100,000 buffered w/ 8.4% NaHCO3 Instrument used: flexible razor blade   Hemostasis achieved with: pressure, aluminum chloride and electrodesiccation   Outcome: patient tolerated procedure well   Post-procedure details: sterile dressing applied and wound care instructions given   Dressing type: bandage and petrolatum    Destruction of lesion Complexity: extensive   Destruction method: electrodesiccation and curettage   Informed consent: discussed and consent obtained   Timeout:  patient name, date of  birth, surgical site, and procedure verified Procedure prep:  Patient was prepped and draped in usual sterile fashion Prep type:  Isopropyl alcohol Anesthesia: the lesion was anesthetized in a standard fashion   Anesthetic:  1% lidocaine w/ epinephrine 1-100,000 buffered w/ 8.4% NaHCO3 Curettage performed in three different directions: Yes   Electrodesiccation performed over the curetted area: Yes   Lesion length (cm):  1.1 Lesion width (cm):  1.1 Margin per side (cm):  0.2 Final wound size (cm):  1.5 Hemostasis achieved with:  pressure, aluminum chloride and electrodesiccation Outcome: patient tolerated procedure well with no complications   Post-procedure details: sterile dressing applied and wound care instructions given   Dressing type: bandage and petrolatum    Specimen 1 - Surgical pathology Differential Diagnosis: r/o SCC  Check Margins: No Hyperkeratotic papule 1.1 cm  Actinic Damage - chronic, secondary to cumulative UV radiation exposure/sun exposure over time - diffuse scaly erythematous macules with underlying dyspigmentation - Recommend daily broad spectrum sunscreen SPF 30+ to sun-exposed areas, reapply every 2 hours as needed.  - Recommend staying in the shade or wearing long sleeves, sun glasses (UVA+UVB protection) and wide brim hats (4-inch brim around the entire circumference of the hat). - Call for new or changing lesions.  Return for TBSE, as scheduled.  Graciella Belton, RMA, am acting as scribe for Sarina Ser, MD . Documentation: I have reviewed the above documentation for accuracy and completeness, and I agree with the above.  Sarina Ser, MD

## 2021-02-26 ENCOUNTER — Encounter: Payer: Self-pay | Admitting: Dermatology

## 2021-02-27 DIAGNOSIS — N521 Erectile dysfunction due to diseases classified elsewhere: Secondary | ICD-10-CM | POA: Diagnosis not present

## 2021-02-27 DIAGNOSIS — N401 Enlarged prostate with lower urinary tract symptoms: Secondary | ICD-10-CM | POA: Diagnosis not present

## 2021-02-27 DIAGNOSIS — Z79899 Other long term (current) drug therapy: Secondary | ICD-10-CM | POA: Diagnosis not present

## 2021-02-28 ENCOUNTER — Telehealth: Payer: Self-pay

## 2021-02-28 NOTE — Telephone Encounter (Signed)
-----   Message from Ralene Bathe, MD sent at 02/22/2021  6:08 PM EST ----- Diagnosis Skin , right volar distal forearm/wrist WELL DIFFERENTIATED SQUAMOUS CELL CARCINOMA, BASE INVOLVED  Cancer - SCC Already treated Recheck next visit

## 2021-02-28 NOTE — Telephone Encounter (Signed)
Advised patient of results/hd  

## 2021-05-15 ENCOUNTER — Ambulatory Visit: Payer: Medicare HMO | Admitting: Dermatology

## 2021-06-08 ENCOUNTER — Other Ambulatory Visit: Payer: Self-pay

## 2021-06-08 ENCOUNTER — Ambulatory Visit: Payer: Medicare Other | Admitting: Dermatology

## 2021-06-08 DIAGNOSIS — L720 Epidermal cyst: Secondary | ICD-10-CM

## 2021-06-08 DIAGNOSIS — L820 Inflamed seborrheic keratosis: Secondary | ICD-10-CM

## 2021-06-08 DIAGNOSIS — L918 Other hypertrophic disorders of the skin: Secondary | ICD-10-CM

## 2021-06-08 DIAGNOSIS — D229 Melanocytic nevi, unspecified: Secondary | ICD-10-CM

## 2021-06-08 DIAGNOSIS — L814 Other melanin hyperpigmentation: Secondary | ICD-10-CM

## 2021-06-08 DIAGNOSIS — Z1283 Encounter for screening for malignant neoplasm of skin: Secondary | ICD-10-CM

## 2021-06-08 DIAGNOSIS — D1800 Hemangioma unspecified site: Secondary | ICD-10-CM

## 2021-06-08 DIAGNOSIS — Z85828 Personal history of other malignant neoplasm of skin: Secondary | ICD-10-CM | POA: Diagnosis not present

## 2021-06-08 DIAGNOSIS — Z86018 Personal history of other benign neoplasm: Secondary | ICD-10-CM

## 2021-06-08 DIAGNOSIS — L821 Other seborrheic keratosis: Secondary | ICD-10-CM

## 2021-06-08 DIAGNOSIS — L578 Other skin changes due to chronic exposure to nonionizing radiation: Secondary | ICD-10-CM | POA: Diagnosis not present

## 2021-06-08 NOTE — Progress Notes (Signed)
? ?Follow-Up Visit ?  ?Subjective  ?Andrew Rice is a 76 y.o. male who presents for the following: Annual Exam (History of SCC - TBSE today). ? ?Accompanied by wife ? ?The patient presents for Total-Body Skin Exam (TBSE) for skin cancer screening and mole check.  The patient has spots, moles and lesions to be evaluated, some may be new or changing and the patient has concerns that these could be cancer. ? ?The following portions of the chart were reviewed this encounter and updated as appropriate:  ? Tobacco  Allergies  Meds  Problems  Med Hx  Surg Hx  Fam Hx   ?  ?Review of Systems:  No other skin or systemic complaints except as noted in HPI or Assessment and Plan. ? ?Objective  ?Well appearing patient in no apparent distress; mood and affect are within normal limits. ? ?A full examination was performed including scalp, head, eyes, ears, nose, lips, neck, chest, axillae, abdomen, back, buttocks, bilateral upper extremities, bilateral lower extremities, hands, feet, fingers, toes, fingernails, and toenails. All findings within normal limits unless otherwise noted below. ? ?Scalp (2) ?Erythematous stuck-on, waxy papule or plaque ? ?Head - Anterior (Face) ?Smooth white papule(s).  ? ? ?Assessment & Plan  ? ?History of Squamous Cell Carcinoma of the Skin ?- No evidence of recurrence today ?- No lymphadenopathy ?- Recommend regular full body skin exams ?- Recommend daily broad spectrum sunscreen SPF 30+ to sun-exposed areas, reapply every 2 hours as needed.  ?- Call if any new or changing lesions are noted between office visits ? ?History of Dysplastic Nevi ?- No evidence of recurrence today ?- Recommend regular full body skin exams ?- Recommend daily broad spectrum sunscreen SPF 30+ to sun-exposed areas, reapply every 2 hours as needed.  ?- Call if any new or changing lesions are noted between office visits ? ?Acrochordons (Skin Tags) ?- Fleshy, skin-colored pedunculated papules ?- Benign appearing.  ?-  Observe. ?- If desired, they can be removed with an in office procedure that is not covered by insurance. ?- Please call the clinic if you notice any new or changing lesions. ? ?Lentigines ?- Scattered tan macules ?- Due to sun exposure ?- Benign-appearing, observe ?- Recommend daily broad spectrum sunscreen SPF 30+ to sun-exposed areas, reapply every 2 hours as needed. ?- Call for any changes ? ?Seborrheic Keratoses ?- Stuck-on, waxy, tan-brown papules and/or plaques  ?- Benign-appearing ?- Discussed benign etiology and prognosis. ?- Observe ?- Call for any changes ? ?Melanocytic Nevi ?- Tan-brown and/or pink-flesh-colored symmetric macules and papules ?- Benign appearing on exam today ?- Observation ?- Call clinic for new or changing moles ?- Recommend daily use of broad spectrum spf 30+ sunscreen to sun-exposed areas.  ? ?Hemangiomas ?- Red papules ?- Discussed benign nature ?- Observe ?- Call for any changes ? ?Actinic Damage ?- Chronic condition, secondary to cumulative UV/sun exposure ?- diffuse scaly erythematous macules with underlying dyspigmentation ?- Recommend daily broad spectrum sunscreen SPF 30+ to sun-exposed areas, reapply every 2 hours as needed.  ?- Staying in the shade or wearing long sleeves, sun glasses (UVA+UVB protection) and wide brim hats (4-inch brim around the entire circumference of the hat) are also recommended for sun protection.  ?- Call for new or changing lesions. ? ?Skin cancer screening performed today. ? ?Inflamed seborrheic keratosis (2) ?Scalp ? ?Destruction of lesion - Scalp ?Complexity: simple   ?Destruction method: cryotherapy   ?Informed consent: discussed and consent obtained   ?Timeout:  patient name,  date of birth, surgical site, and procedure verified ?Lesion destroyed using liquid nitrogen: Yes   ?Region frozen until ice ball extended beyond lesion: Yes   ?Outcome: patient tolerated procedure well with no complications   ?Post-procedure details: wound care instructions  given   ? ?[place] ?Head - Anterior (Face) ?Benign-appearing.  Observation.  Call clinic for new or changing lesions.  Recommend daily use of broad spectrum spf 30+ sunscreen to sun-exposed areas.  ? ?Epidermal inclusion cyst ?Upper back spinal ?Benign-appearing. Exam most consistent with an epidermal inclusion cyst. Discussed that a cyst is a benign growth that can grow over time and sometimes get irritated or inflamed. Recommend observation if it is not bothersome. Discussed option of surgical excision to remove it if it is growing, symptomatic, or other changes noted. Please call for new or changing lesions so they can be evaluated. ? ?Return in about 1 year (around 06/09/2022) for TBSE. ? ?I, Ashok Cordia, CMA, am acting as scribe for Sarina Ser, MD . ?Documentation: I have reviewed the above documentation for accuracy and completeness, and I agree with the above. ? ?Sarina Ser, MD ? ? ?

## 2021-06-08 NOTE — Patient Instructions (Signed)

## 2021-06-09 ENCOUNTER — Encounter: Payer: Self-pay | Admitting: Dermatology

## 2022-06-13 ENCOUNTER — Ambulatory Visit: Payer: Medicare Other | Admitting: Dermatology

## 2022-06-13 VITALS — BP 125/76 | HR 66

## 2022-06-13 DIAGNOSIS — Z86018 Personal history of other benign neoplasm: Secondary | ICD-10-CM

## 2022-06-13 DIAGNOSIS — L820 Inflamed seborrheic keratosis: Secondary | ICD-10-CM

## 2022-06-13 DIAGNOSIS — L821 Other seborrheic keratosis: Secondary | ICD-10-CM

## 2022-06-13 DIAGNOSIS — Z85828 Personal history of other malignant neoplasm of skin: Secondary | ICD-10-CM

## 2022-06-13 DIAGNOSIS — L578 Other skin changes due to chronic exposure to nonionizing radiation: Secondary | ICD-10-CM

## 2022-06-13 DIAGNOSIS — Z8589 Personal history of malignant neoplasm of other organs and systems: Secondary | ICD-10-CM

## 2022-06-13 DIAGNOSIS — Z1283 Encounter for screening for malignant neoplasm of skin: Secondary | ICD-10-CM | POA: Diagnosis not present

## 2022-06-13 DIAGNOSIS — L814 Other melanin hyperpigmentation: Secondary | ICD-10-CM

## 2022-06-13 DIAGNOSIS — L720 Epidermal cyst: Secondary | ICD-10-CM | POA: Diagnosis not present

## 2022-06-13 DIAGNOSIS — D229 Melanocytic nevi, unspecified: Secondary | ICD-10-CM

## 2022-06-13 DIAGNOSIS — L918 Other hypertrophic disorders of the skin: Secondary | ICD-10-CM

## 2022-06-13 NOTE — Patient Instructions (Addendum)
Cryotherapy Aftercare  Wash gently with soap and water everyday.   Apply Vaseline and Band-Aid daily until healed.     Seborrheic Keratosis  What causes seborrheic keratoses? Seborrheic keratoses are harmless, common skin growths that first appear during adult life.  As time goes by, more growths appear.  Some people may develop a large number of them.  Seborrheic keratoses appear on both covered and uncovered body parts.  They are not caused by sunlight.  The tendency to develop seborrheic keratoses can be inherited.  They vary in color from skin-colored to gray, brown, or even black.  They can be either smooth or have a rough, warty surface.   Seborrheic keratoses are superficial and look as if they were stuck on the skin.  Under the microscope this type of keratosis looks like layers upon layers of skin.  That is why at times the top layer may seem to fall off, but the rest of the growth remains and re-grows.    Treatment Seborrheic keratoses do not need to be treated, but can easily be removed in the office.  Seborrheic keratoses often cause symptoms when they rub on clothing or jewelry.  Lesions can be in the way of shaving.  If they become inflamed, they can cause itching, soreness, or burning.  Removal of a seborrheic keratosis can be accomplished by freezing, burning, or surgery. If any spot bleeds, scabs, or grows rapidly, please return to have it checked, as these can be an indication of a skin cancer.    Melanoma ABCDEs  Melanoma is the most dangerous type of skin cancer, and is the leading cause of death from skin disease.  You are more likely to develop melanoma if you: Have light-colored skin, light-colored eyes, or red or blond hair Spend a lot of time in the sun Tan regularly, either outdoors or in a tanning bed Have had blistering sunburns, especially during childhood Have a close family member who has had a melanoma Have atypical moles or large birthmarks  Early  detection of melanoma is key since treatment is typically straightforward and cure rates are extremely high if we catch it early.   The first sign of melanoma is often a change in a mole or a new dark spot.  The ABCDE system is a way of remembering the signs of melanoma.  A for asymmetry:  The two halves do not match. B for border:  The edges of the growth are irregular. C for color:  A mixture of colors are present instead of an even brown color. D for diameter:  Melanomas are usually (but not always) greater than 31m - the size of a pencil eraser. E for evolution:  The spot keeps changing in size, shape, and color.  Please check your skin once per month between visits. You can use a small mirror in front and a large mirror behind you to keep an eye on the back side or your body.   If you see any new or changing lesions before your next follow-up, please call to schedule a visit.  Please continue daily skin protection including broad spectrum sunscreen SPF 30+ to sun-exposed areas, reapplying every 2 hours as needed when you're outdoors.   Staying in the shade or wearing long sleeves, sun glasses (UVA+UVB protection) and wide brim hats (4-inch brim around the entire circumference of the hat) are also recommended for sun protection.    Due to recent changes in healthcare laws, you may see results of your  your pathology and/or laboratory studies on MyChart before the doctors have had a chance to review them. We understand that in some cases there may be results that are confusing or concerning to you. Please understand that not all results are received at the same time and often the doctors may need to interpret multiple results in order to provide you with the best plan of care or course of treatment. Therefore, we ask that you please give us 2 business days to thoroughly review all your results before contacting the office for clarification. Should we see a critical lab result, you will be contacted  sooner.   If You Need Anything After Your Visit  If you have any questions or concerns for your doctor, please call our main line at [phone] and press option 4 to reach your doctor's medical assistant. If no one answers, please leave a voicemail as directed and we will return your call as soon as possible. Messages left after 4 pm will be answered the following business day.   You may also send us a message via MyChart. We typically respond to MyChart messages within 1-2 business days.  For prescription refills, please ask your pharmacy to contact our office. Our fax number is [phone].  If you have an urgent issue when the clinic is closed that cannot wait until the next business day, you can page your doctor at the number below.    Please note that while we do our best to be available for urgent issues outside of office hours, we are not available 24/7.   If you have an urgent issue and are unable to reach us, you may choose to seek medical care at your doctor's office, retail clinic, urgent care center, or emergency room.  If you have a medical emergency, please immediately call 911 or go to the emergency department.  Pager Numbers  - Dr. Kowalski: [phone]  - Dr. Moye: [phone]  - Dr. Stewart: [phone]  In the event of inclement weather, please call our main line at [phone] for an update on the status of any delays or closures.  Dermatology Medication Tips: Please keep the boxes that topical medications come in in order to help keep track of the instructions about where and how to use these. Pharmacies typically print the medication instructions only on the boxes and not directly on the medication tubes.   If your medication is too expensive, please contact our office at [phone] option 4 or send us a message through MyChart.   We are unable to tell what your co-pay for medications will be in advance as this is different depending on your insurance  coverage. However, we may be able to find a substitute medication at lower cost or fill out paperwork to get insurance to cover a needed medication.   If a prior authorization is required to get your medication covered by your insurance company, please allow us 1-2 business days to complete this process.  Drug prices often vary depending on where the prescription is filled and some pharmacies may offer cheaper prices.  The website www.goodrx.com contains coupons for medications through different pharmacies. The prices here do not account for what the cost may be with help from insurance (it may be cheaper with your insurance), but the website can give you the price if you did not use any insurance.  - You can print the associated coupon and take it with your prescription to the pharmacy.  - You may also stop   by our office during regular business hours and pick up a GoodRx coupon card.  - If you need your prescription sent electronically to a different pharmacy, notify our office through [facility] MyChart or by phone at [phone] option 4.     Si Usted Necesita Algo Despus de Su Visita  Tambin puede enviarnos un mensaje a travs de MyChart. Por lo general respondemos a los mensajes de MyChart en el transcurso de 1 a 2 das hbiles.  Para renovar recetas, por favor pida a su farmacia que se ponga en contacto con nuestra oficina. Nuestro nmero de fax es el [phone].  Si tiene un asunto urgente cuando la clnica est cerrada y que no puede esperar hasta el siguiente da hbil, puede llamar/localizar a su doctor(a) al nmero que aparece a continuacin.   Por favor, tenga en cuenta que aunque hacemos todo lo posible para estar disponibles para asuntos urgentes fuera del horario de oficina, no estamos disponibles las 24 horas del da, los 7 das de la semana.   Si tiene un problema urgente y no puede comunicarse con nosotros, puede optar por buscar atencin mdica  en el consultorio de  su doctor(a), en una clnica privada, en un centro de atencin urgente o en una sala de emergencias.  Si tiene una emergencia mdica, por favor llame inmediatamente al 911 o vaya a la sala de emergencias.  Nmeros de bper  - Dr. Kowalski: [phone]  - Dra. Moye: [phone]  - Dra. Stewart: [phone]  En caso de inclemencias del tiempo, por favor llame a nuestra lnea principal al [phone] para una actualizacin sobre el estado de cualquier retraso o cierre.  Consejos para la medicacin en dermatologa: Por favor, guarde las cajas en las que vienen los medicamentos de uso tpico para ayudarle a seguir las instrucciones sobre dnde y cmo usarlos. Las farmacias generalmente imprimen las instrucciones del medicamento slo en las cajas y no directamente en los tubos del medicamento.   Si su medicamento es muy caro, por favor, pngase en contacto con nuestra oficina llamando al [phone] y presione la opcin 4 o envenos un mensaje a travs de MyChart.   No podemos decirle cul ser su copago por los medicamentos por adelantado ya que esto es diferente dependiendo de la cobertura de su seguro. Sin embargo, es posible que podamos encontrar un medicamento sustituto a menor costo o llenar un formulario para que el seguro cubra el medicamento que se considera necesario.   Si se requiere una autorizacin previa para que su compaa de seguros cubra su medicamento, por favor permtanos de 1 a 2 das hbiles para completar este proceso.  Los precios de los medicamentos varan con frecuencia dependiendo del lugar de dnde se surte la receta y alguna farmacias pueden ofrecer precios ms baratos.  El sitio web www.goodrx.com tiene cupones para medicamentos de diferentes farmacias. Los precios aqu no tienen en cuenta lo que podra costar con la ayuda del seguro (puede ser ms barato con su seguro), pero el sitio web puede darle el precio si no utiliz ningn seguro.  - Puede imprimir el  cupn correspondiente y llevarlo con su receta a la farmacia.  - Tambin puede pasar por nuestra oficina durante el horario de atencin regular y recoger una tarjeta de cupones de GoodRx.  - Si necesita que su receta se enve electrnicamente a una farmacia diferente, informe a nuestra oficina a travs de MyChart de [facility] o por telfono llamando al [phone] y presione la opcin 4.  

## 2022-06-13 NOTE — Progress Notes (Signed)
Follow-Up Visit   Subjective  Andrew Rice is a 77 y.o. male who presents for the following: Annual Exam (1 year tbse, hx of dyspastic nevus, hx of scc). The patient presents for Total-Body Skin Exam (TBSE) for skin cancer screening and mole check.  The patient has spots, moles and lesions to be evaluated, some may be new or changing and the patient has concerns that these could be cancer.  The following portions of the chart were reviewed this encounter and updated as appropriate:  Tobacco  Allergies  Meds  Problems  Med Hx  Surg Hx  Fam Hx     Review of Systems: No other skin or systemic complaints except as noted in HPI or Assessment and Plan.  Objective  Well appearing patient in no apparent distress; mood and affect are within normal limits.  A full examination was performed including scalp, head, eyes, ears, nose, lips, neck, chest, axillae, abdomen, back, buttocks, bilateral upper extremities, bilateral lower extremities, hands, feet, fingers, toes, fingernails, and toenails. All findings within normal limits unless otherwise noted below.  upper back spinal Subcutaneous nodule.   left chest x 1 Erythematous stuck-on, waxy papule or plaque   Assessment & Plan  Epidermal inclusion cyst upper back spinal Benign-appearing. Exam most consistent with an epidermal inclusion cyst. Discussed that a cyst is a benign growth that can grow over time and sometimes get irritated or inflamed. Recommend observation if it is not bothersome. Discussed option of surgical excision to remove it if it is growing, symptomatic, or other changes noted. Please call for new or changing lesions so they can be evaluated.  Inflamed seborrheic keratosis left chest x 1 Symptomatic, irritating, patient would like treated. Destruction of lesion - left chest x 1 Complexity: simple   Destruction method: cryotherapy   Informed consent: discussed and consent obtained   Timeout:  patient name, date of  birth, surgical site, and procedure verified Lesion destroyed using liquid nitrogen: Yes   Region frozen until ice ball extended beyond lesion: Yes   Outcome: patient tolerated procedure well with no complications   Post-procedure details: wound care instructions given   Additional details:  Prior to procedure, discussed risks of blister formation, small wound, skin dyspigmentation, or rare scar following cryotherapy. Recommend Vaseline ointment to treated areas while healing.  Lentigines - Scattered tan macules - Due to sun exposure - Benign-appearing, observe - Recommend daily broad spectrum sunscreen SPF 30+ to sun-exposed areas, reapply every 2 hours as needed. - Call for any changes  Seborrheic Keratoses - Stuck-on, waxy, tan-brown papules and/or plaques  - Benign-appearing - Discussed benign etiology and prognosis. - Observe - Call for any changes  Acrochordons (Skin Tags) - Fleshy, skin-colored pedunculated papules - Benign appearing.  - Observe. - If desired, they can be removed with an in office procedure that is not covered by insurance. - Please call the clinic if you notice any new or changing lesions.  Melanocytic Nevi - Tan-brown and/or pink-flesh-colored symmetric macules and papules - Benign appearing on exam today - Observation - Call clinic for new or changing moles - Recommend daily use of broad spectrum spf 30+ sunscreen to sun-exposed areas.   Hemangiomas - Red papules - Discussed benign nature - Observe - Call for any changes  Actinic Damage - Chronic condition, secondary to cumulative UV/sun exposure - diffuse scaly erythematous macules with underlying dyspigmentation - Recommend daily broad spectrum sunscreen SPF 30+ to sun-exposed areas, reapply every 2 hours as needed.  -  Staying in the shade or wearing long sleeves, sun glasses (UVA+UVB protection) and wide brim hats (4-inch brim around the entire circumference of the hat) are also recommended  for sun protection.  - Call for new or changing lesions.  History of Dysplastic Nevi See history - No evidence of recurrence today - Recommend regular full body skin exams - Recommend daily broad spectrum sunscreen SPF 30+ to sun-exposed areas, reapply every 2 hours as needed.  - Call if any new or changing lesions are noted between office visits  History of Squamous Cell Carcinoma of the Skin See history  - No evidence of recurrence today - No lymphadenopathy - Recommend regular full body skin exams - Recommend daily broad spectrum sunscreen SPF 30+ to sun-exposed areas, reapply every 2 hours as needed.  - Call if any new or changing lesions are noted between office visits  Skin cancer screening performed today. Return in about 1 year (around 06/13/2023) for TBSE.  IRuthell Rummage, CMA, am acting as scribe for Sarina Ser, MD. Documentation: I have reviewed the above documentation for accuracy and completeness, and I agree with the above.  Sarina Ser, MD

## 2022-06-16 ENCOUNTER — Encounter: Payer: Self-pay | Admitting: Dermatology

## 2022-11-08 ENCOUNTER — Ambulatory Visit: Payer: Medicare Other | Admitting: Dermatology

## 2022-11-13 ENCOUNTER — Ambulatory Visit: Payer: Medicare Other | Admitting: Dermatology

## 2022-11-13 ENCOUNTER — Encounter: Payer: Self-pay | Admitting: Dermatology

## 2022-11-13 VITALS — BP 150/82 | HR 76

## 2022-11-13 DIAGNOSIS — C44722 Squamous cell carcinoma of skin of right lower limb, including hip: Secondary | ICD-10-CM

## 2022-11-13 DIAGNOSIS — D492 Neoplasm of unspecified behavior of bone, soft tissue, and skin: Secondary | ICD-10-CM

## 2022-11-13 DIAGNOSIS — C4492 Squamous cell carcinoma of skin, unspecified: Secondary | ICD-10-CM

## 2022-11-13 HISTORY — DX: Squamous cell carcinoma of skin, unspecified: C44.92

## 2022-11-13 NOTE — Patient Instructions (Addendum)
Wound Care Instructions  Cleanse wound gently with soap and water once a day then pat dry with clean gauze. Apply a thin coat of Petrolatum (petroleum jelly, "Vaseline") over the wound (unless you have an allergy to this). We recommend that you use a new, sterile tube of Vaseline. Do not pick or remove scabs. Do not remove the yellow or white "healing tissue" from the base of the wound.  Cover the wound with fresh, clean, nonstick gauze and secure with paper tape. You may use Band-Aids in place of gauze and tape if the wound is small enough, but would recommend trimming much of the tape off as there is often too much. Sometimes Band-Aids can irritate the skin.  You should call the office for your biopsy report after 1 week if you have not already been contacted.  If you experience any problems, such as abnormal amounts of bleeding, swelling, significant bruising, significant pain, or evidence of infection, please call the office immediately.  FOR ADULT SURGERY PATIENTS: If you need something for pain relief you may take 1 extra strength Tylenol (acetaminophen) AND 2 Ibuprofen (200mg  each) together every 4 hours as needed for pain. (do not take these if you are allergic to them or if you have a reason you should not take them.) Typically, you may only need pain medication for 1 to 3 days.   Due to recent changes in healthcare laws, you may see results of your pathology and/or laboratory studies on MyChart before the doctors have had a chance to review them. We understand that in some cases there may be results that are confusing or concerning to you. Please understand that not all results are received at the same time and often the doctors may need to interpret multiple results in order to provide you with the best plan of care or course of treatment. Therefore, we ask that you please give [place] 2 business days to thoroughly review all your results before contacting the office for clarification. Should we  see a critical lab result, you will be contacted sooner.   If You Need Anything After Your Visit  If you have any questions or concerns for your doctor, please call our main line at [phone] and press option 4 to reach your doctor's medical assistant. If no one answers, please leave a voicemail as directed and we will return your call as soon as possible. Messages left after 4 pm will be answered the following business day.   You may also send [place] a message via MyChart. We typically respond to MyChart messages within 1-2 business days.  For prescription refills, please ask your pharmacy to contact our office. Our fax number is [phone].  If you have an urgent issue when the clinic is closed that cannot wait until the next business day, you can page your doctor at the number below.    Please note that while we do our best to be available for urgent issues outside of office hours, we are not available 24/7.   If you have an urgent issue and are unable to reach [place], you may choose to seek medical care at your doctor's office, retail clinic, urgent care center, or emergency room.  If you have a medical emergency, please immediately call 911 or go to the emergency department.  Pager Numbers  - Dr. Gwen Pounds: [phone]  - Dr. Roseanne Reno: [phone]  In the event of inclement weather, please call our main line at [phone] for an update on the status  of any delays or closures.  Dermatology Medication Tips: Please keep the boxes that topical medications come in in order to help keep track of the instructions about where and how to use these. Pharmacies typically print the medication instructions only on the boxes and not directly on the medication tubes.   If your medication is too expensive, please contact our office at [phone] option 4 or send [place] a message through MyChart.   We are unable to tell what your co-pay for medications will be in advance as this is different  depending on your insurance coverage. However, we may be able to find a substitute medication at lower cost or fill out paperwork to get insurance to cover a needed medication.   If a prior authorization is required to get your medication covered by your insurance company, please allow [place] 1-2 business days to complete this process.  Drug prices often vary depending on where the prescription is filled and some pharmacies may offer cheaper prices.  The website www.goodrx.com contains coupons for medications through different pharmacies. The prices here do not account for what the cost may be with help from insurance (it may be cheaper with your insurance), but the website can give you the price if you did not use any insurance.  - You can print the associated coupon and take it with your prescription to the pharmacy.  - You may also stop by our office during regular business hours and pick up a GoodRx coupon card.  - If you need your prescription sent electronically to a different pharmacy, notify our office through [facility] or by phone at [phone] option 4.     Si Usted Necesita Algo Despus de Su Visita  Tambin puede enviarnos un mensaje a travs de Clinical cytogeneticist. Por lo general respondemos a los mensajes de MyChart en el transcurso de 1 a 2 das hbiles.  Para renovar recetas, por favor pida a su farmacia que se ponga en contacto con nuestra oficina. Annie Sable de fax es [place] [phone].  Si tiene un asunto urgente cuando la clnica est cerrada y que no puede esperar hasta el siguiente da hbil, puede llamar/localizar a su doctor(a) al nmero que aparece a continuacin.   Por favor, tenga en cuenta que aunque hacemos todo lo posible para estar disponibles para asuntos urgentes fuera del horario de [place], no estamos disponibles las 24 horas del da, los 7 [address]  [address] de la [place].   Si tiene un problema urgente y no puede comunicarse con nosotros, puede optar por buscar atencin  mdica  en el consultorio de su doctor(a), en una clnica privada, en un centro de atencin urgente o en una sala de emergencias.  Si tiene Engineer, drilling, por favor llame inmediatamente al 911 o vaya a la sala de emergencias.  Nmeros de bper  - Dr. Gwen Pounds: [phone]  - Dra. Roseanne Reno: [phone]  En caso de inclemencias del [place], por favor llame a Lacy Duverney principal al [phone] para una actualizacin sobre el [place] de cualquier retraso o cierre.  Consejos para la medicacin en dermatologa: Por favor, guarde las cajas en las que vienen los medicamentos de uso tpico para ayudarle a seguir las instrucciones sobre dnde y cmo usarlos. Las farmacias generalmente imprimen las instrucciones del medicamento slo en las cajas y no directamente en los tubos del [place].   Si su medicamento es muy caro, por favor, pngase en contacto con Rolm Gala llamando al [phone] y presione la opcin 4 o envenos  un mensaje a travs de MyChart.   No podemos decirle cul ser su copago por los medicamentos por adelantado ya que esto es diferente dependiendo de la cobertura de su seguro. Sin embargo, es posible que podamos encontrar un medicamento sustituto a Audiological scientist un formulario para que el seguro cubra el medicamento que se considera necesario.   Si se requiere una autorizacin previa para que su compaa de seguros [place] su medicamento, por favor permtanos de 1 a 2 das hbiles para completar [address]  Los precios de los medicamentos varan con frecuencia dependiendo del Environmental consultant de dnde se surte la receta y alguna farmacias pueden ofrecer precios ms baratos.  El sitio web www.goodrx.com tiene cupones para medicamentos de Health and safety inspector. Los precios aqu no tienen en cuenta lo que podra costar con la ayuda del seguro (puede ser ms barato con su seguro), pero el sitio web puede darle el precio si no utiliz Tourist information centre manager.  - Puede imprimir el  cupn correspondiente y llevarlo con su receta a la farmacia.  - Tambin puede pasar por nuestra oficina durante el horario de atencin regular y Education officer, museum una tarjeta de cupones de GoodRx.  - Si necesita que su receta se enve electrnicamente a una farmacia diferente, informe a nuestra oficina a travs de MyChart de [place] o por telfono llamando al [phone] y presione la opcin 4.

## 2022-11-13 NOTE — Progress Notes (Signed)
   Follow-Up Visit   Subjective  Andrew Rice is a 77 y.o. male who presents for the following: Spot at right posterior calf. Dur: couple of months. Stings if touched. Denies bleeding.  The patient has spots, moles and lesions to be evaluated, some may be new or changing and the patient may have concern these could be cancer.  This patient is accompanied in the office by his spouse.    The following portions of the chart were reviewed this encounter and updated as appropriate: medications, allergies, medical history  Review of Systems:  No other skin or systemic complaints except as noted in HPI or Assessment and Plan.  Objective  Well appearing patient in no apparent distress; mood and affect are within normal limits.  A focused examination was performed of the following areas: Right posterior calf  Relevant physical exam findings are noted in the Assessment and Plan.  right posterior calf Pink firm papule 9 mm         Assessment & Plan   Neoplasm of skin right posterior calf  Skin / nail biopsy Type of biopsy: tangential   Informed consent: discussed and consent obtained   Timeout: patient name, date of birth, surgical site, and procedure verified   Procedure prep:  Patient was prepped and draped in usual sterile fashion Prep type:  Isopropyl alcohol Anesthesia: the lesion was anesthetized in a standard fashion   Anesthetic:  1% lidocaine w/ epinephrine 1-100,000 buffered w/ 8.4% NaHCO3 Instrument used: flexible razor blade   Hemostasis achieved with: pressure, aluminum chloride and electrodesiccation   Outcome: patient tolerated procedure well   Post-procedure details: sterile dressing applied and wound care instructions given   Dressing type: bandage and petrolatum    Specimen 1 - Surgical pathology Differential Diagnosis: R/O SCC  Check Margins: No     Return for TBSE As Scheduled.  I, Lawson Radar, CMA, am acting as scribe for Elie Goody, MD.   Documentation: I have reviewed the above documentation for accuracy and completeness, and I agree with the above.  Elie Goody, MD

## 2022-11-21 ENCOUNTER — Telehealth: Payer: Self-pay

## 2022-11-21 NOTE — Telephone Encounter (Signed)
-----   Message from [place] sent at 11/20/2022  9:37 PM EDT ----- Diagnosis: Skin , right posterior calf SQUAMOUS CELL CARCINOMA, KERATOACANTHOMA TYPE  Please call to share diagnosis and discuss treatment options. Please message me with patient's choice and schedule ED&C or excision  Explanation: This is a squamous cell skin cancer that has grown beyond the surface of the skin and is invading the second layer of the skin. It has the potential to spread beyond the skin and threaten your health, so I recommend treating it.  Treatment option 1: you return for a 15 minute appointment where we perform electrodesiccation and curettage [facility]). This involves three rounds of scraping and burning to destroy the skin cancer. It has about an 75-85% cure rate and leaves a round wound slightly larger than the skin cancer and leaves a round Lillyann Ahart scar. No additional pathology is done. If the skin cancer comes back, we would need to do a surgery to remove it.   Treatment option 2: you return for an hour long appointment where we perform a skin surgery. We numb the site of the skin cancer and a safety margin of normal skin around it. We remove the full thickness of skin and close the wound with two layers of stitches. The sample is sent to the lab to check that the skin cancer was fully removed. Return one week later to have wound checked and surface stitches removed. Surgical wound leaves a line scar. Approximately 95% cure rate. Risk of recurrence, bleeding, infection, pain, injury to nearby structures, hypertrophic scar.

## 2022-11-21 NOTE — Telephone Encounter (Signed)
Patient and spouse advised together on the phone. They would like to do an excision with Dr. Katrinka Blazing. Patient scheduled for next Wednesday at 11:30. aw

## 2022-11-28 ENCOUNTER — Ambulatory Visit (INDEPENDENT_AMBULATORY_CARE_PROVIDER_SITE_OTHER): Payer: Medicare Other | Admitting: Dermatology

## 2022-11-28 ENCOUNTER — Encounter: Payer: Self-pay | Admitting: Dermatology

## 2022-11-28 VITALS — BP 139/84

## 2022-11-28 DIAGNOSIS — C44722 Squamous cell carcinoma of skin of right lower limb, including hip: Secondary | ICD-10-CM

## 2022-11-28 DIAGNOSIS — C4492 Squamous cell carcinoma of skin, unspecified: Secondary | ICD-10-CM

## 2022-11-28 MED ORDER — MUPIROCIN 2 % EX OINT
1.0000 | TOPICAL_OINTMENT | Freq: Every day | CUTANEOUS | 0 refills | Status: DC
Start: 2022-11-28 — End: 2023-04-23

## 2022-11-28 NOTE — Patient Instructions (Addendum)
 Wound Care Instructions  On the day following your surgery, you should begin doing daily dressing changes: Remove the old dressing and discard it. Cleanse the wound gently with tap water. This may be done in the shower or by placing a wet gauze pad directly on the wound and letting it soak for several minutes. It is important to gently remove any dried blood from the wound in order to encourage healing. This may be done by gently rolling a moistened Q-tip on the dried blood. Do not pick at the wound. If the wound should start to bleed, continue cleaning the wound, then place a moist gauze pad on the wound and hold pressure for a few minutes.  Make sure you then dry the skin surrounding the wound completely or the tape will not stick to the skin. Do not use cotton balls on the wound. After the wound is clean and dry, apply the ointment gently with a Q-tip. Cut a non-stick pad to fit the size of the wound. Lay the pad flush to the wound. If the wound is draining, you may want to reinforce it with a small amount of gauze on top of the non-stick pad for a little added compression to the area. Use the tape to seal the area completely. Select from the following with respect to your individual situation: If your wound has been stitched closed: continue the above steps 1-8 at least daily until your sutures are removed. If your wound has been left open to heal: continue steps 1-8 at least daily for the first 3-4 weeks. We would like for you to take a few extra precautions for at least the next week. Sleep with your head elevated on pillows if our wound is on your head. Do not bend over or lift heavy items to reduce the chance of elevated blood pressure to the wound Do not participate in particularly strenuous activities.   Below is a list of dressing supplies you might need.  Cotton-tipped applicators - Q-tips Gauze pads (2x2 and/or 4x4) - All-Purpose Sponges Non-stick dressing material - Telfa Tape -  Paper or Hypafix New and clean tube of petroleum jelly - Vaseline    Comments on Post-Operative Period Slight swelling and redness often appear around the wound. This is normal and will disappear within several days following the surgery. The healing wound will drain a brownish-red-yellow discharge during healing. This is a normal phase of wound healing. As the wound begins to heal, the drainage may increase in amount. Again, this drainage is normal. Notify us if the drainage becomes persistently bloody, excessively swollen, or intensely painful or develops a foul odor or red streaks.  If you should experience mild discomfort during the healing phase, you may take an aspirin-free medication such as Tylenol (acetaminophen). Notify us if the discomfort is severe or persistent. Avoid alcoholic beverages when taking pain medicine.  In Case of Wound Hemorrhage A wound hemorrhage is when the bandage suddenly becomes soaked with bright red blood and flows profusely. If this happens, sit down or lie down with your head elevated. If the wound has a dressing on it, do not remove the dressing. Apply pressure to the existing gauze. If the wound is not covered, use a gauze pad to apply pressure and continue applying the pressure for 20 minutes without peeking. DO NOT COVER THE WOUND WITH A LARGE TOWEL OR WASH CLOTH. Release your hand from the wound site but do not remove the dressing. If the bleeding has stopped,  gently clean around the wound. Leave the dressing in place for 24 hours if possible. This wait time allows the blood vessels to close off so that you do not spark a new round of bleeding by disrupting the newly clotted blood vessels with an immediate dressing change. If the bleeding does not subside, continue to hold pressure. If matters are out of your control, contact an After Hours clinic or go to the Emergency Room.    Due to recent changes in healthcare laws, you may see results of your pathology  and/or laboratory studies on MyChart before the doctors have had a chance to review them. We understand that in some cases there may be results that are confusing or concerning to you. Please understand that not all results are received at the same time and often the doctors may need to interpret multiple results in order to provide you with the best plan of care or course of treatment. Therefore, we ask that you please give [place] 2 business days to thoroughly review all your results before contacting the office for clarification. Should we see a critical lab result, you will be contacted sooner.   If You Need Anything After Your Visit  If you have any questions or concerns for your doctor, please call our main line at [phone] and press option 4 to reach your doctor's medical assistant. If no one answers, please leave a voicemail as directed and we will return your call as soon as possible. Messages left after 4 pm will be answered the following business day.   You may also send [place] a message via MyChart. We typically respond to MyChart messages within 1-2 business days.  For prescription refills, please ask your pharmacy to contact our office. Our fax number is [phone].  If you have an urgent issue when the clinic is closed that cannot wait until the next business day, you can page your doctor at the number below.    Please note that while we do our best to be available for urgent issues outside of office hours, we are not available 24/7.   If you have an urgent issue and are unable to reach [place], you may choose to seek medical care at your doctor's office, retail clinic, urgent care center, or emergency room.  If you have a medical emergency, please immediately call 911 or go to the emergency department.  Pager Numbers  - Dr. Gwen Pounds: [phone]  - Dr. Roseanne Reno: [phone]  - Dr. Katrinka Blazing: [phone]   In the event of inclement weather, please call our main line at [phone]  for an update on the status of any delays or closures.  Dermatology Medication Tips: Please keep the boxes that topical medications come in in order to help keep track of the instructions about where and how to use these. Pharmacies typically print the medication instructions only on the boxes and not directly on the medication tubes.   If your medication is too expensive, please contact our office at [phone] option 4 or send [place] a message through MyChart.   We are unable to tell what your co-pay for medications will be in advance as this is different depending on your insurance coverage. However, we may be able to find a substitute medication at lower cost or fill out paperwork to get insurance to cover a needed medication.   If a prior authorization is required to get your medication covered by your insurance company, please allow [place] 1-2 business days to complete this  process.  Drug prices often vary depending on where the prescription is filled and some pharmacies may offer cheaper prices.  The website www.goodrx.com contains coupons for medications through different pharmacies. The prices here do not account for what the cost may be with help from insurance (it may be cheaper with your insurance), but the website can give you the price if you did not use any insurance.  - You can print the associated coupon and take it with your prescription to the pharmacy.  - You may also stop by our office during regular business hours and pick up a GoodRx coupon card.  - If you need your prescription sent electronically to a different pharmacy, notify our office through [facility] or by phone at [phone] option 4.     Si Usted Necesita Algo Despus de Su Visita  Tambin puede enviarnos un mensaje a travs de Clinical cytogeneticist. Por lo general respondemos a los mensajes de MyChart en el transcurso de 1 a 2 das hbiles.  Para renovar recetas, por favor pida a su farmacia que se ponga en contacto  con nuestra oficina. Annie Sable de fax es [place] [phone].  Si tiene un asunto urgente cuando la clnica est cerrada y que no puede esperar hasta el siguiente da hbil, puede llamar/localizar a su doctor(a) al nmero que aparece a continuacin.   Por favor, tenga en cuenta que aunque hacemos todo lo posible para estar disponibles para asuntos urgentes fuera del horario de [place], no estamos disponibles las 24 horas del da, los 7 [address]  [address] de la [place].   Si tiene un problema urgente y no puede comunicarse con nosotros, puede optar por buscar atencin mdica  en el consultorio de su doctor(a), en una clnica privada, en un centro de atencin urgente o en una sala de emergencias.  Si tiene Engineer, drilling, por favor llame inmediatamente al 911 o vaya a la sala de emergencias.  Nmeros de bper  - Dr. Gwen Pounds: [phone]  - Dra. Roseanne Reno: [phone]  - Dr. Katrinka Blazing: [phone]   En caso de inclemencias del tiempo, por favor llame a Lacy Duverney principal al [phone] para una actualizacin sobre el [place] de cualquier retraso o cierre.  Consejos para la medicacin en dermatologa: Por favor, guarde las cajas en las que vienen los medicamentos de uso tpico para ayudarle a seguir las instrucciones sobre dnde y cmo usarlos. Las farmacias generalmente imprimen las instrucciones del medicamento slo en las cajas y no directamente en los tubos del [place].   Si su medicamento es muy caro, por favor, pngase en contacto con Rolm Gala llamando al [phone] y presione la opcin 4 o envenos un mensaje a travs de Clinical cytogeneticist.   No podemos decirle cul ser su copago por los medicamentos por adelantado ya que esto es diferente dependiendo de la cobertura de su seguro. Sin embargo, es posible que podamos encontrar un medicamento sustituto a Audiological scientist un formulario para que el seguro cubra el medicamento que se considera necesario.   Si se requiere una autorizacin  previa para que su compaa de seguros [place] su medicamento, por favor permtanos de 1 a 2 das hbiles para completar [address]  Los precios de los medicamentos varan con frecuencia dependiendo del Environmental consultant de dnde se surte la receta y alguna farmacias pueden ofrecer precios ms baratos.  El sitio web www.goodrx.com tiene cupones para medicamentos de Health and safety inspector. Los precios aqu no tienen en cuenta lo que podra costar con la ayuda del seguro (  puede ser ms barato con su seguro), pero el sitio web puede darle el precio si no Visual merchandiser.  - Puede imprimir el cupn correspondiente y llevarlo con su receta a la farmacia.  - Tambin puede pasar por nuestra oficina durante el horario de atencin regular y Education officer, museum una tarjeta de cupones de GoodRx.  - Si necesita que su receta se enve electrnicamente a una farmacia diferente, informe a nuestra oficina a travs de MyChart de [place] o por telfono llamando al [phone] y presione la opcin 4.

## 2022-11-28 NOTE — Progress Notes (Signed)
   Follow-Up Visit   Subjective  Andrew Rice is a 77 y.o. male who presents for the following: Bx proven SCC KA type of the R post calf, pt presents for excision The patient has spots, moles and lesions to be evaluated, some may be new or changing and the patient may have concern these could be cancer.  Patient accompanied by his wife.  The following portions of the chart were reviewed this encounter and updated as appropriate: medications, allergies, medical history  Review of Systems:  No other skin or systemic complaints except as noted in HPI or Assessment and Plan.  Objective  Well appearing patient in no apparent distress; mood and affect are within normal limits.   A focused examination was performed of the following areas: Right leg  Relevant exam findings are noted in the Assessment and Plan.  R post calf Pink bx site 1 cm    Assessment & Plan     Squamous cell carcinoma of skin R post calf  mupirocin ointment (BACTROBAN) 2 % Apply 1 Application topically daily. qd to excision site right leg  Skin excision  Lesion length (cm):  1 Lesion width (cm):  1 Margin per side (cm):  0.4 Total excision diameter (cm):  1.8 Informed consent: discussed and consent obtained   Timeout: patient name, date of birth, surgical site, and procedure verified   Procedure prep:  Patient was prepped and draped in usual sterile fashion Prep type:  Povidone-iodine Anesthesia: the lesion was anesthetized in a standard fashion   Anesthetic:  1% lidocaine w/ epinephrine 1-100,000 buffered w/ 8.4% NaHCO3 (6cc lido w/ epi, 6cc bupivicaine, Total of 12cc) Instrument used: #15 blade   Hemostasis achieved with: pressure and electrodesiccation   Outcome: patient tolerated procedure well with no complications    Skin repair Complexity:  Intermediate Final length (cm):  6 Informed consent: discussed and consent obtained   Timeout: patient name, date of birth, surgical site, and  procedure verified   Reason for type of repair: reduce tension to allow closure, reduce the risk of dehiscence, infection, and necrosis, reduce subcutaneous dead space and avoid a hematoma, preserve normal anatomical and functional relationships and enhance both functionality and cosmetic results   Undermining: edges undermined   Subcutaneous layers (deep stitches):  Suture size:  4-0 and 3-0 Suture type: Monocryl (poliglecaprone 25) and PDS (polydioxanone)   Stitches:  Buried vertical mattress (inverted dermal) Fine/surface layer approximation (top stitches):  Suture size:  4-0 Suture type: Prolene (polypropylene)   Suture type comment:  Nylon Stitches: simple running   Suture removal (days):  7 Hemostasis achieved with: suture Outcome: patient tolerated procedure well with no complications   Post-procedure details: sterile dressing applied and wound care instructions given   Dressing type: pressure dressing and bacitracin (Mupirocin ointment)   Additional details:  Tagged at superior tail.  Specimen 1 - Surgical pathology Differential Diagnosis: Bx proven SCC KA type  Check Margins: yes 1 cm Pink bx site [phone] Tagged at superior tail.  KA type, bx proven, Excised today Start Mupirocin oint qd to excision site    Return in about 1 week (around 12/05/2022) for suture removal.  I, Sonya Hupman, RMA, am acting as scribe for Elie Goody, MD .   Documentation: I have reviewed the above documentation for accuracy and completeness, and I agree with the above.  Elie Goody, MD

## 2022-11-29 ENCOUNTER — Telehealth: Payer: Self-pay

## 2022-11-29 ENCOUNTER — Ambulatory Visit: Payer: Medicare Other | Admitting: Dermatology

## 2022-11-29 NOTE — Telephone Encounter (Signed)
Patient doing well after yesterdays surgery./sh 

## 2022-11-30 LAB — SURGICAL PATHOLOGY

## 2022-12-03 ENCOUNTER — Telehealth: Payer: Self-pay

## 2022-12-03 NOTE — Telephone Encounter (Signed)
Patient advised margins free, JS

## 2022-12-03 NOTE — Telephone Encounter (Signed)
-----   Message from [place] sent at 12/03/2022  3:48 PM EDT ----- Skin (M), R post calf NO RESIDUAL SQUAMOUS CELL CARCINOMA, MARGINS FREE  Please call to share that excision was clear of skin cancer and get update on surgical wound. Thank you.

## 2022-12-06 ENCOUNTER — Encounter: Payer: Self-pay | Admitting: Dermatology

## 2022-12-06 ENCOUNTER — Ambulatory Visit: Payer: Medicare Other | Admitting: Dermatology

## 2022-12-06 DIAGNOSIS — Z4889 Encounter for other specified surgical aftercare: Secondary | ICD-10-CM

## 2022-12-06 DIAGNOSIS — Z4802 Encounter for removal of sutures: Secondary | ICD-10-CM

## 2022-12-06 NOTE — Progress Notes (Signed)
   Follow-Up Visit   Subjective  Andrew Rice is a 77 y.o. male who presents for the following: Suture removal  Pathology showed no residual SCC, margins free.  The following portions of the chart were reviewed this encounter and updated as appropriate: medications, allergies, medical history  Review of Systems:  No other skin or systemic complaints except as noted in HPI or Assessment and Plan.  Objective  Well appearing patient in no apparent distress; mood and affect are within normal limits.  Areas Examined: Right calf Relevant physical exam findings are noted in the Assessment and Plan.    Assessment & Plan   Encounter for Removal of Sutures - Incision site is clean, dry and intact. - Wound cleansed, sutures removed, wound cleansed and steri strips applied.  - Discussed pathology results showing no residual SCC, margins free - Patient advised to keep steri-strips dry until they fall off. - Scars remodel for a full year. - Once steri-strips fall off, patient can apply over-the-counter silicone scar cream once to twice a day to help with scar remodeling if desired. - Patient advised to call with any concerns or if they notice any new or changing lesions.  Return for as scheduled.  Andrew Rice, RMA, am acting as scribe for Andrew Goody, MD .   Documentation: I have reviewed the above documentation for accuracy and completeness, and I agree with the above.  Andrew Goody, MD

## 2023-01-15 ENCOUNTER — Encounter: Payer: Self-pay | Admitting: Dermatology

## 2023-01-15 ENCOUNTER — Ambulatory Visit (INDEPENDENT_AMBULATORY_CARE_PROVIDER_SITE_OTHER): Payer: Medicare Other | Admitting: Dermatology

## 2023-01-15 DIAGNOSIS — T8131XA Disruption of external operation (surgical) wound, not elsewhere classified, initial encounter: Secondary | ICD-10-CM

## 2023-01-15 NOTE — Patient Instructions (Signed)

## 2023-01-15 NOTE — Progress Notes (Signed)
   Follow-Up Visit   Subjective  Andrew Rice is a 77 y.o. male who presents for the following: recheck of right posterior calf. Hx of SCC excised 8/21/204. Patient states there is a suture left in incision site. States area has become red and inflamed. Looked as if it had pus in it.   This patient is accompanied in the office by his  wife .   The following portions of the chart were reviewed this encounter and updated as appropriate: medications, allergies, medical history  Review of Systems:  No other skin or systemic complaints except as noted in HPI or Assessment and Plan.  Objective  Well appearing patient in no apparent distress; mood and affect are within normal limits.  A focused examination was performed of the following areas: Right calf  Relevant exam findings are noted in the Assessment and Plan.  Left Lower Leg - Posterior Erythematous, edematous nodule at superior aspect of surgical scar.        Assessment & Plan     Spitting suture, initial encounter Left Lower Leg - Posterior  Lesion anesthetized with lido. 1% w/epi. Spitting suture removed. No purulent drainage observed. Mupirocin and bandage applied. Continue washing once daily with soap and water. Apply Mupirocin and band-aid. RTC in 1 week for recheck.  N/C for visit today per Dr. Katrinka Blazing.     Return in about 1 week (around 01/22/2023) for Nurse visit wound check.  I, Lawson Radar, CMA, am acting as scribe for Elie Goody, MD.   Documentation: I have reviewed the above documentation for accuracy and completeness, and I agree with the above.  Elie Goody, MD

## 2023-01-21 ENCOUNTER — Telehealth: Payer: Self-pay

## 2023-01-21 NOTE — Telephone Encounter (Signed)
Patient came into the office today for a wound check of right back leg. Patient has no complaints of pain or possible infection.  Photo taken and placed in media. aw

## 2023-01-22 NOTE — Progress Notes (Signed)
ERROR, appt cancelled.

## 2023-02-07 ENCOUNTER — Encounter: Payer: Self-pay | Admitting: Dermatology

## 2023-02-07 ENCOUNTER — Ambulatory Visit: Payer: Medicare Other | Admitting: Dermatology

## 2023-02-07 DIAGNOSIS — Z5189 Encounter for other specified aftercare: Secondary | ICD-10-CM

## 2023-02-07 NOTE — Patient Instructions (Signed)

## 2023-02-07 NOTE — Progress Notes (Signed)
   Follow-Up Visit   Subjective  Andrew Rice is a 77 y.o. male who presents for the following: check excision site R post calf, hx of spitting suture and pt says still a bump and tender The patient has spots, moles and lesions to be evaluated, some may be new or changing and the patient may have concern these could be cancer.   The following portions of the chart were reviewed this encounter and updated as appropriate: medications, allergies, medical history  Review of Systems:  No other skin or systemic complaints except as noted in HPI or Assessment and Plan.  Objective  Well appearing patient in no apparent distress; mood and affect are within normal limits.   A focused examination was performed of the following areas: R post calf  Relevant exam findings are noted in the Assessment and Plan.    Assessment & Plan   SUTURE GRANULOMA R post calf at [facility] excision site Exam: well healed scar with 3 erythematous paps within   Treatment Plan: Discussed [place] Kenalog injections, pt declines Discussed the suture should resolve over time   Visit for wound check    Return for as scheduled.  I, Ardis Rowan, RMA, am acting as scribe for Elie Goody, MD .   Documentation: I have reviewed the above documentation for accuracy and completeness, and I agree with the above.  Elie Goody, MD

## 2023-04-22 ENCOUNTER — Other Ambulatory Visit: Payer: Self-pay

## 2023-04-22 DIAGNOSIS — N401 Enlarged prostate with lower urinary tract symptoms: Secondary | ICD-10-CM

## 2023-04-23 ENCOUNTER — Other Ambulatory Visit
Admission: RE | Admit: 2023-04-23 | Discharge: 2023-04-23 | Disposition: A | Discharge status: Home / Self Care | Payer: Medicare Other | Attending: Urology | Admitting: Urology

## 2023-04-23 ENCOUNTER — Ambulatory Visit: Payer: Medicare Other | Admitting: Urology

## 2023-04-23 VITALS — BP 161/84 | HR 74 | Ht 69.0 in | Wt 186.0 lb

## 2023-04-23 DIAGNOSIS — N529 Male erectile dysfunction, unspecified: Secondary | ICD-10-CM | POA: Diagnosis not present

## 2023-04-23 DIAGNOSIS — N401 Enlarged prostate with lower urinary tract symptoms: Secondary | ICD-10-CM | POA: Diagnosis present

## 2023-04-23 DIAGNOSIS — Z125 Encounter for screening for malignant neoplasm of prostate: Secondary | ICD-10-CM | POA: Diagnosis not present

## 2023-04-23 LAB — URINALYSIS, COMPLETE (UACMP) WITH MICROSCOPIC
Bilirubin Urine: NEGATIVE
Glucose, UA: NEGATIVE mg/dL
Hgb urine dipstick: NEGATIVE
Ketones, ur: NEGATIVE mg/dL
Leukocytes,Ua: NEGATIVE
Nitrite: NEGATIVE
Protein, ur: NEGATIVE mg/dL
Specific Gravity, Urine: 1.01 (ref 1.005–1.030)
pH: 6.5 (ref 5.0–8.0)

## 2023-04-23 LAB — BLADDER SCAN AMB NON-IMAGING

## 2023-04-23 MED ORDER — TADALAFIL 10 MG PO TABS: 10.0000 mg | ORAL_TABLET | Freq: Every day | ORAL | 11 refills | Status: AC | PRN

## 2023-04-23 NOTE — Progress Notes (Signed)
 04/23/23 11:20 AM   Andrew Rice [birth year] [id number]  CC: BPH/LUTS, PSA screening, ED  HPI: 78 year old male previously followed by Dr. Gala referred to establish care with urology here.  He has a history of BPH with obstructive urinary symptoms and reportedly underwent a in office microwave procedure with Dr. Gala many years ago with minimal improvement, followed by a greenlight laser PVP in 2017.  He reports his urination has overall been very good since that time with nocturia just once per night.  He is no longer taking any prostate medications.  IPSS score today is 6, with quality-of-life mostly satisfied.  He has ED with moderate improvement on 100 mg sildenafil on demand.  His wife has some other health issues so he is rarely sexually active.  He was interested in trialing Cialis .  PSA has been normal, most recently 1.82 from November 2024 which is stable from prior, no further screening needed per the guideline recommendations.  Urinalysis today is benign, PVR is normal at 74ml.  PMH: Past Medical History:  Diagnosis Date   Complication of anesthesia    Dysplastic nevus 08/04/2019   right bicep, moderate to severe   Dysrhythmia    H/O PALPITATIONS-STOPPED CAFFEINE 15 YRS AGO AND PALPITATIONS    History of dysplastic nevus 06/04/2019    right inf pectoral just inf to aerola, moderate atypia   History of kidney stones    Motion sickness    on rough seas   PONV (postoperative nausea and vomiting)    after cataract surgery 2012   SCC (squamous cell carcinoma) 11/13/2022   R posterior calf, excised 11/28/22   Squamous cell carcinoma of skin 03/21/2020   L lower leg   Squamous cell carcinoma of skin 02/20/2021   Right volar distal forearm/wrist - [facility]    Surgical History: Past Surgical History:  Procedure Laterality Date   CATARACT EXTRACTION W/PHACO Right 06/18/2017   Procedure: CATARACT EXTRACTION PHACO AND INTRAOCULAR LENS PLACEMENT (IOC) COMPLICATED RIGHT;   Surgeon: Mittie Gaskin, MD;  [facility];  Service: Ophthalmology;  Laterality: Right;   CIRCUMCISION     COLONOSCOPY     CYST EXCISION     CHIN   EYE SURGERY  2012   [facility] - Left cataract   GREEN LIGHT LASER TURP (TRANSURETHRAL RESECTION OF PROSTATE N/A 02/07/2016   Procedure: GREEN LIGHT LASER TURP (TRANSURETHRAL RESECTION OF PROSTATE;  Surgeon: Ozell JONELLE Burkes, MD;  Location: [facility];  Service: Urology;  Laterality: N/A;   VASECTOMY      Family History: No family history on file.  Social History:  reports that he quit smoking about 47 years ago. His smoking use included cigarettes. He started smoking about 57 years ago. He has a 10 pack-year smoking history. He has never used smokeless tobacco. He reports current alcohol use of about 2.0 standard drinks of alcohol per week. He reports that he does not use drugs.  Physical Exam: BP (!) 161/84 (BP Location: Left Arm, Patient Position: Sitting, Cuff Size: Normal)   Pulse 74   Ht 5' 9 (1.753 m)   Wt 186 lb (84.4 kg)   SpO2 97%   BMI 27.47 kg/m    Constitutional:  Alert and oriented, No acute distress. Cardiovascular: No clubbing, cyanosis, or edema. Respiratory: Normal respiratory effort, no increased work of breathing. GI: Abdomen is soft, nontender, nondistended, no abdominal masses   Laboratory Data: Reviewed, see HPI   Assessment & Plan:   Healthy 78 year old male previously followed  by Dr. Gala, history of greenlight laser PVP in 2017 with good results and no current urinary symptoms at this time-urinalysis today benign and PVR normal at 75 mL.  Return precautions were discussed including gross hematuria, worsening urinary symptoms, UTIs, or retention.  Reassurance provided regarding normal PSA values, no further PSA screening needed per the guideline recommendations.  Regarding his ED, he was interested in a trial of Cialis  10-20 mg on demand and risks and benefits were discussed.  Cialis  can be  filled by PCP in the future if helpful.  Trial of Cialis  10 to 20 mg He prefers follow-up with urology as needed   Redell Burnet, MD 04/23/2023  [facility] [address] [place], [place] [address] [phone]

## 2023-04-23 NOTE — Patient Instructions (Signed)
 Prostate Cancer Screening  Prostate cancer screening is testing that is done to check for the presence of prostate cancer in men. The prostate gland is a walnut-sized gland that is located below the bladder and in front of the rectum in males. The function of the prostate is to add fluid to semen during ejaculation. Prostate cancer is one of the most common types of cancer in men. Who should have prostate cancer screening? Screening recommendations vary based on age and other risk factors, as well as between the professional organizations who make the recommendations. In general, screening is recommended if: You are age 71 to 58 and have an average risk for prostate cancer. You should talk with your health care provider about your need for screening and how often screening should be done. Because most prostate cancers are slow growing and will not cause death, screening in this age group is generally reserved for men who have a 10- to 15-year life expectancy. You are younger than age 63, and you have these risk factors: Having a father, brother, or uncle who has been diagnosed with prostate cancer. The risk is higher if your family member's cancer occurred at an early age or if you have multiple family members with prostate cancer at an early age. Being a male who is Black or is of Caribbean or sub-Saharan African descent. In general, screening is not recommended if: You are younger than age 65. You are between the ages of 38 and 18 and you have no risk factors. You are 5 years of age or older. At this age, the risks that screening can cause are greater than the benefits that it may provide. If you are at high risk for prostate cancer, your health care provider may recommend that you have screenings more often or that you start screening at a younger age. How is screening for prostate cancer done? The recommended prostate cancer screening test is a blood test called the prostate-specific antigen  (PSA) test. PSA is a protein that is made in the prostate. As you age, your prostate naturally produces more PSA. Abnormally high PSA levels may be caused by: Prostate cancer. An enlarged prostate that is not caused by cancer (benign prostatic hyperplasia, or BPH). This condition is very common in older men. A prostate gland infection (prostatitis) or urinary tract infection. Certain medicines such as male hormones (like testosterone ) or other medicines that raise testosterone  levels.  Depending on the PSA results, you may need more tests, such as: A physical exam to check the size of your prostate gland, if not done as part of screening. Blood and imaging tests. A procedure to remove tissue samples from your prostate gland for testing (biopsy). This is the only way to know for certain if you have prostate cancer. What are the benefits of prostate cancer screening? Screening can help to identify cancer at an early stage, before symptoms start and when the cancer can be treated more easily. There is a small chance that screening may lower your risk of dying from prostate cancer. The chance is small because prostate cancer is a slow-growing cancer, and most men with prostate cancer die from a different cause. What are the risks of prostate cancer screening? The main risk of prostate cancer screening is diagnosing and treating prostate cancer that would never have caused any symptoms or problems. This is called overdiagnosisand overtreatment. PSA screening cannot tell you if your PSA is high due to cancer or a different cause. A prostate  biopsy is the only procedure to diagnose prostate cancer. Even the results of a biopsy may not tell you if your cancer needs to be treated. Slow-growing prostate cancer may not need any treatment other than monitoring, so diagnosing and treating it may cause unnecessary stress or other side effects. Questions to ask your health care provider When should I start prostate  cancer screening? What is my risk for prostate cancer? How often do I need screening? What type of screening tests do I need? How do I get my test results? What do my results mean? Do I need treatment? Where to find more information The American Cancer Society: www.cancer.org American Urological Association: www.auanet.org Contact a health care provider if: You have difficulty urinating. You have pain when you urinate or ejaculate. You have blood in your urine or semen. You have pain in your back or in the area of your prostate. Summary Prostate cancer is a common type of cancer in men. The prostate gland is located below the bladder and in front of the rectum. This gland adds fluid to semen during ejaculation. Prostate cancer screening may identify cancer at an early stage, when the cancer can be treated more easily and is less likely to have spread to other areas of the body. The prostate-specific antigen (PSA) test is the recommended screening test for prostate cancer, but it has associated risks. Discuss the risks and benefits of prostate cancer screening with your health care provider. If you are age 6 or older, the risks that screening can cause are greater than the benefits that it may provide. This information is not intended to replace advice given to you by your health care provider. Make sure you discuss any questions you have with your health care provider. Document Revised: 09/19/2020 Document Reviewed: 09/19/2020 Elsevier Patient Education  2024 [employer].

## 2023-06-20 ENCOUNTER — Ambulatory Visit: Payer: Medicare Other | Admitting: Dermatology

## 2023-06-24 ENCOUNTER — Encounter: Payer: Self-pay | Admitting: Dermatology

## 2023-06-24 ENCOUNTER — Ambulatory Visit: Admitting: Dermatology

## 2023-06-24 DIAGNOSIS — L918 Other hypertrophic disorders of the skin: Secondary | ICD-10-CM | POA: Diagnosis not present

## 2023-06-24 DIAGNOSIS — D692 Other nonthrombocytopenic purpura: Secondary | ICD-10-CM

## 2023-06-24 DIAGNOSIS — Z85828 Personal history of other malignant neoplasm of skin: Secondary | ICD-10-CM

## 2023-06-24 DIAGNOSIS — Z8589 Personal history of malignant neoplasm of other organs and systems: Secondary | ICD-10-CM

## 2023-06-24 DIAGNOSIS — Z1283 Encounter for screening for malignant neoplasm of skin: Secondary | ICD-10-CM

## 2023-06-24 DIAGNOSIS — W908XXA Exposure to other nonionizing radiation, initial encounter: Secondary | ICD-10-CM

## 2023-06-24 DIAGNOSIS — L578 Other skin changes due to chronic exposure to nonionizing radiation: Secondary | ICD-10-CM | POA: Diagnosis not present

## 2023-06-24 DIAGNOSIS — L814 Other melanin hyperpigmentation: Secondary | ICD-10-CM

## 2023-06-24 DIAGNOSIS — Z86018 Personal history of other benign neoplasm: Secondary | ICD-10-CM

## 2023-06-24 DIAGNOSIS — L729 Follicular cyst of the skin and subcutaneous tissue, unspecified: Secondary | ICD-10-CM

## 2023-06-24 DIAGNOSIS — D1801 Hemangioma of skin and subcutaneous tissue: Secondary | ICD-10-CM

## 2023-06-24 DIAGNOSIS — L820 Inflamed seborrheic keratosis: Secondary | ICD-10-CM

## 2023-06-24 DIAGNOSIS — L821 Other seborrheic keratosis: Secondary | ICD-10-CM

## 2023-06-24 DIAGNOSIS — L720 Epidermal cyst: Secondary | ICD-10-CM

## 2023-06-24 DIAGNOSIS — L738 Other specified follicular disorders: Secondary | ICD-10-CM

## 2023-06-24 DIAGNOSIS — D229 Melanocytic nevi, unspecified: Secondary | ICD-10-CM

## 2023-06-24 NOTE — Progress Notes (Signed)
 Follow-Up Visit   Subjective  Andrew Rice is a 78 y.o. male who presents for the following: Skin Cancer Screening and Full Body Skin Exam  The patient presents for Total-Body Skin Exam (TBSE) for skin cancer screening and mole check. The patient has spots, moles and lesions to be evaluated, some may be new or changing and the patient may have concern these could be cancer.  The following portions of the chart were reviewed this encounter and updated as appropriate: medications, allergies, medical history  Review of Systems:  No other skin or systemic complaints except as noted in HPI or Assessment and Plan.  Objective  Well appearing patient in no apparent distress; mood and affect are within normal limits.  A full examination was performed including scalp, head, eyes, ears, nose, lips, neck, chest, axillae, abdomen, back, buttocks, bilateral upper extremities, bilateral lower extremities, hands, feet, fingers, toes, fingernails, and toenails. All findings within normal limits unless otherwise noted below.   Relevant physical exam findings are noted in the Assessment and Plan.  R upper back x 3, B/L axilla x 7 (10) Erythematous stuck-on, waxy papule or plaque B/L axilla x 20 (20) Fleshy, skin-colored pedunculated papules.    Assessment & Plan   SKIN CANCER SCREENING PERFORMED TODAY.  ACTINIC DAMAGE - Chronic condition, secondary to cumulative UV/sun exposure - diffuse scaly erythematous macules with underlying dyspigmentation - Recommend daily broad spectrum sunscreen SPF 30+ to sun-exposed areas, reapply every 2 hours as needed.  - Staying in the shade or wearing long sleeves, sun glasses (UVA+UVB protection) and wide brim hats (4-inch brim around the entire circumference of the hat) are also recommended for sun protection.  - Call for new or changing lesions.  LENTIGINES, SEBORRHEIC KERATOSES, HEMANGIOMAS - Benign normal skin lesions - Benign-appearing - Call for any  changes  MELANOCYTIC NEVI - Tan-brown and/or pink-flesh-colored symmetric macules and papules - Benign appearing on exam today - Observation - Call clinic for new or changing moles - Recommend daily use of broad spectrum spf 30+ sunscreen to sun-exposed areas.   HISTORY OF SQUAMOUS CELL CARCINOMA OF THE SKIN - No evidence of recurrence today - No lymphadenopathy - Recommend regular full body skin exams - Recommend daily broad spectrum sunscreen SPF 30+ to sun-exposed areas, reapply every 2 hours as needed.  - Call if any new or changing lesions are noted between office visits  HISTORY OF DYSPLASTIC NEVI No evidence of recurrence today Recommend regular full body skin exams Recommend daily broad spectrum sunscreen SPF 30+ to sun-exposed areas, reapply every 2 hours as needed.  Call if any new or changing lesions are noted between office visits  Sebaceous Hyperplasia - Small yellow papules with a central dell - Benign-appearing - Observe. Call for changes.   EPIDERMAL INCLUSION CYST Exam: Subcutaneous nodule at L upper back 1.0 cm Benign-appearing. Exam most consistent with an epidermal inclusion cyst. Discussed that a cyst is a benign growth that can grow over time and sometimes get irritated or inflamed. Recommend observation if it is not bothersome. Discussed option of surgical excision to remove it if it is growing, symptomatic, or other changes noted. Please call for new or changing lesions so they can be evaluated.  Purpura - Chronic; persistent and recurrent.  Treatable, but not curable. - Violaceous macules and patches - Benign - Related to trauma, age, sun damage and/or use of blood thinners, chronic use of topical and/or oral steroids - Observe - Can use OTC arnica containing moisturizer such  as Dermend Bruise Formula if desired - Call for worsening or other concerns  INFLAMED SEBORRHEIC KERATOSIS (10) R upper back x 3, B/L axilla x 7 (10) Destruction of lesion - R  upper back x 3, B/L axilla x 7 (10) Complexity: simple   Destruction method: cryotherapy   Informed consent: discussed and consent obtained   Timeout:  patient name, date of birth, surgical site, and procedure verified Lesion destroyed using liquid nitrogen: Yes   Region frozen until ice ball extended beyond lesion: Yes   Outcome: patient tolerated procedure well with no complications   Post-procedure details: wound care instructions given   SKIN TAG (20) B/L axilla x 20 (20) Symptomatic, irritating, patient would like treated.  SKIN TAGS (Acrochordons)  - Erythematous fleshy, skin-colored pedunculated papules - Benign appearing.  - Symptomatic, irritating, patient would like treated. He is aware that skin tags are benign lesions, and their removal is often not considered medically necessary.  Their insurance may not cover the procedure. We recommend all patients call their insurance company to confirm whether the procedure is covered before having it done. Informed consent was obtained. Procedure Note: - Prior to the procedure, reviewed the expected small wound. Also reviewed the risk of leaving a small scar and the small risk of infection.  PROCEDURE - The areas were prepped with isopropyl alcohol. A small amount of lidocaine 1% with epinephrine was injected at the base of each lesion to achieve good local anesthesia. The skin tags were removed using a snip technique. Aluminum chloride was used for hemostasis. Petrolatum and a bandage were applied. The procedure was tolerated well. - Wound care was reviewed with the patient. They were advised to call with any concerns.  - Locations: B/L axilla - Total number of treated acrochordons 20     Destruction of lesion - B/L axilla x 20 (20) Complexity: simple   Destruction method: cryotherapy   Informed consent: discussed and consent obtained   Timeout:  patient name, date of birth, surgical site, and procedure verified Lesion destroyed  using liquid nitrogen: Yes   Region frozen until ice ball extended beyond lesion: Yes   Outcome: patient tolerated procedure well with no complications   Post-procedure details: wound care instructions given    Return in about 1 year (around 06/23/2024) for TBSE - hx dysplastic nevi, SCC.  I, Cari Caraway, CMA, am acting as scribe for Armida Sans, MD .  Documentation: I have reviewed the above documentation for accuracy and completeness, and I agree with the above.  Armida Sans, MD

## 2023-06-24 NOTE — Patient Instructions (Signed)

## 2023-08-27 ENCOUNTER — Encounter: Payer: Self-pay | Admitting: Dermatology

## 2023-08-27 ENCOUNTER — Ambulatory Visit: Admitting: Dermatology

## 2023-08-27 DIAGNOSIS — N400 Enlarged prostate without lower urinary tract symptoms: Secondary | ICD-10-CM | POA: Insufficient documentation

## 2023-08-27 DIAGNOSIS — L720 Epidermal cyst: Secondary | ICD-10-CM | POA: Diagnosis not present

## 2023-08-27 DIAGNOSIS — H409 Unspecified glaucoma: Secondary | ICD-10-CM | POA: Insufficient documentation

## 2023-08-27 DIAGNOSIS — D492 Neoplasm of unspecified behavior of bone, soft tissue, and skin: Secondary | ICD-10-CM | POA: Diagnosis not present

## 2023-08-27 DIAGNOSIS — Z85828 Personal history of other malignant neoplasm of skin: Secondary | ICD-10-CM | POA: Diagnosis not present

## 2023-08-27 DIAGNOSIS — H9312 Tinnitus, left ear: Secondary | ICD-10-CM | POA: Insufficient documentation

## 2023-08-27 DIAGNOSIS — Z87898 Personal history of other specified conditions: Secondary | ICD-10-CM | POA: Insufficient documentation

## 2023-08-27 DIAGNOSIS — D485 Neoplasm of uncertain behavior of skin: Secondary | ICD-10-CM

## 2023-08-27 NOTE — Progress Notes (Signed)
   Follow-Up Visit   Subjective  Andrew Rice is a 78 y.o. male who presents for the following: spot at right hand, present for about a month, is sore to touch. Patient had SCC at right posterior calf excised 11/2022 and patient has noticed a spot come up at the same area.   Patient accompanied by wife who contributes to hx.   The patient has spots, moles and lesions to be evaluated, some may be new or changing and the patient may have concern these could be cancer.   The following portions of the chart were reviewed this encounter and updated as appropriate: medications, allergies, medical history  Review of Systems:  No other skin or systemic complaints except as noted in HPI or Assessment and Plan.  Objective  Well appearing patient in no apparent distress; mood and affect are within normal limits.   A focused examination was performed of the following areas: R hand R lower posterior leg  Relevant exam findings are noted in the Assessment and Plan.    Assessment & Plan   Milia - tiny firm white papule at right posterior calf - type of cyst - benign - sometimes these will clear with nightly OTC adapalene/Differin 0.1% gel or retinol. - will plan to extract at follow up   NEOPLASM OF UNCERTAIN BEHAVIOR OF SKIN Right Dorsal Hand Patient has a fishing trip this weekend and defers biopsy today. Will have patient RTC to biopsy.  MILIA    Return for Biopsy, as scheduled, with Dr. Felipe Horton.  Kerstin Peeling, RMA, am acting as scribe for Harris Liming, MD .   Documentation: I have reviewed the above documentation for accuracy and completeness, and I agree with the above.  Harris Liming, MD

## 2023-08-27 NOTE — Patient Instructions (Addendum)

## 2023-09-04 ENCOUNTER — Ambulatory Visit: Admitting: Dermatology

## 2023-09-09 ENCOUNTER — Ambulatory Visit: Admitting: Dermatology

## 2023-09-09 ENCOUNTER — Encounter: Payer: Self-pay | Admitting: Dermatology

## 2023-09-09 DIAGNOSIS — C44622 Squamous cell carcinoma of skin of right upper limb, including shoulder: Secondary | ICD-10-CM

## 2023-09-09 DIAGNOSIS — L720 Epidermal cyst: Secondary | ICD-10-CM

## 2023-09-09 DIAGNOSIS — C4492 Squamous cell carcinoma of skin, unspecified: Secondary | ICD-10-CM

## 2023-09-09 DIAGNOSIS — D485 Neoplasm of uncertain behavior of skin: Secondary | ICD-10-CM

## 2023-09-09 NOTE — Progress Notes (Unsigned)
   Follow-Up Visit   Subjective  Andrew Rice is a 78 y.o. male who presents for the following: biopsy at right dorsal hand and milia extraction.   The patient has spots, moles and lesions to be evaluated, some may be new or changing and the patient may have concern these could be cancer.   The following portions of the chart were reviewed this encounter and updated as appropriate: medications, allergies, medical history  Review of Systems:  No other skin or systemic complaints except as noted in HPI or Assessment and Plan.  Objective  Well appearing patient in no apparent distress; mood and affect are within normal limits.   A focused examination was performed of the following areas: Hand, face  Relevant exam findings are noted in the Assessment and Plan.  right calf 5 mm Smooth white papule on healed incision.  Right Dorsal Hand 8 mm pink keratotic papule  Assessment & Plan     MILIA right calf Acne/Milia surgery - right calf Procedure risks and benefits were discussed with the patient and verbal consent was obtained. Following prep of the skin on the right calf with an alcohol swab, extraction of milia was performed with a cotton tip applicators following superficial incision made over their surfaces with a #11 surgical blade. Cyst contents expressed with pressure and then cyst wall was removed with forceps. Capillary hemostasis was achieved with 35% aluminum chloride solution. Vaseline ointment was applied to each site. The patient tolerated the procedure well.  SCC (SQUAMOUS CELL CARCINOMA) Right Dorsal Hand Skin / nail biopsy Type of biopsy: tangential   Informed consent: discussed and consent obtained   Timeout: patient name, date of birth, surgical site, and procedure verified   Procedure prep:  Patient was prepped and draped in usual sterile fashion Prep type:  Isopropyl alcohol Anesthesia: the lesion was anesthetized in a standard fashion   Anesthetic:  1%  lidocaine  w/ epinephrine  1-100,000 buffered w/ 8.4% NaHCO3 Instrument used: DermaBlade   Hemostasis achieved with: pressure and aluminum chloride   Outcome: patient tolerated procedure well   Post-procedure details: sterile dressing applied and wound care instructions given   Dressing type: bandage and petrolatum    Destruction of lesion Complexity: simple   Destruction method: electrodesiccation and curettage   Informed consent: discussed and consent obtained   Timeout:  patient name, date of birth, surgical site, and procedure verified Procedure prep:  Patient was prepped and draped in usual sterile fashion Prep type:  Isopropyl alcohol Anesthesia: the lesion was anesthetized in a standard fashion   Anesthetic:  1% lidocaine  w/ epinephrine  1-100,000 local infiltration Curettage performed in three different directions: Yes   Electrodesiccation performed over the curetted area: Yes   Curettage cycles:  3 Final wound size (cm):  1 Hemostasis achieved with:  aluminum chloride and electrodesiccation Outcome: patient tolerated procedure well with no complications   Post-procedure details: sterile dressing applied and wound care instructions given   Dressing type: bandage and petrolatum   Specimen 1 - Surgical pathology Differential Diagnosis: r/o SCC  Check Margins: No 8 mm pink keratotic papule EDC today Photo in Media from 08/27/2023 visit.    Return for TBSE, with Dr. Linnell Richardson, as scheduled, HxSCC.  Kerstin Peeling, RMA, am acting as scribe for Harris Liming, MD .   Documentation: I have reviewed the above documentation for accuracy and completeness, and I agree with the above.  Harris Liming, MD

## 2023-09-09 NOTE — Patient Instructions (Signed)

## 2023-09-12 ENCOUNTER — Encounter: Payer: Self-pay | Admitting: Dermatology

## 2023-09-12 ENCOUNTER — Ambulatory Visit: Payer: Self-pay | Admitting: Dermatology

## 2023-09-12 LAB — SURGICAL PATHOLOGY

## 2023-09-16 NOTE — Telephone Encounter (Signed)
-----   Message from [place] sent at 09/12/2023  6:01 PM EDT ----- Diagnosis: right dorsal hand :       WELL DIFFERENTIATED SQUAMOUS CELL CARCINOMA, REGRESSING (TREATED WITH ED&C)    Plan: please call with diagnosis and make 2 month follow up for recheck: SCC already treated with [facility]

## 2023-09-16 NOTE — Telephone Encounter (Signed)
 Discussed pathology results and plan to recheck in 2 months. Appointment scheduled. Patient voiced understanding.

## 2023-11-18 ENCOUNTER — Ambulatory Visit: Admitting: Dermatology

## 2024-06-23 ENCOUNTER — Ambulatory Visit: Admitting: Dermatology
